# Patient Record
Sex: Female | Born: 1995 | Race: White | Hispanic: No | Marital: Single | Smoking: Never smoker
Health system: Southern US, Community
[De-identification: ages and names within clinical notes are randomized; demographics above are authoritative.]

## PROBLEM LIST (undated history)

## (undated) DIAGNOSIS — E119 Type 2 diabetes mellitus without complications: Secondary | ICD-10-CM

## (undated) DIAGNOSIS — R51 Headache: Secondary | ICD-10-CM

## (undated) HISTORY — DX: Headache: R51

## (undated) HISTORY — PX: WISDOM TOOTH EXTRACTION: SHX21

---
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
## 2016-01-25 DIAGNOSIS — L700 Acne vulgaris: Secondary | ICD-10-CM | POA: Diagnosis not present

## 2016-03-17 DIAGNOSIS — S63617A Unspecified sprain of left little finger, initial encounter: Secondary | ICD-10-CM | POA: Diagnosis not present

## 2016-03-17 DIAGNOSIS — S6992XA Unspecified injury of left wrist, hand and finger(s), initial encounter: Secondary | ICD-10-CM | POA: Diagnosis not present

## 2016-04-09 ENCOUNTER — Other Ambulatory Visit: Payer: Self-pay | Admitting: Nurse Practitioner

## 2016-04-09 DIAGNOSIS — Z01419 Encounter for gynecological examination (general) (routine) without abnormal findings: Secondary | ICD-10-CM | POA: Diagnosis not present

## 2016-04-09 DIAGNOSIS — N63 Unspecified lump in unspecified breast: Secondary | ICD-10-CM

## 2016-04-09 DIAGNOSIS — Z6822 Body mass index (BMI) 22.0-22.9, adult: Secondary | ICD-10-CM | POA: Diagnosis not present

## 2016-04-23 ENCOUNTER — Ambulatory Visit
Admission: RE | Admit: 2016-04-23 | Discharge: 2016-04-23 | Disposition: A | Discharge status: Home / Self Care | Payer: BLUE CROSS/BLUE SHIELD | Source: Ambulatory Visit | Attending: Nurse Practitioner | Admitting: Nurse Practitioner

## 2016-04-23 DIAGNOSIS — N63 Unspecified lump in unspecified breast: Secondary | ICD-10-CM

## 2016-04-23 DIAGNOSIS — N6320 Unspecified lump in the left breast, unspecified quadrant: Secondary | ICD-10-CM | POA: Diagnosis not present

## 2016-05-31 ENCOUNTER — Other Ambulatory Visit: Payer: Self-pay | Admitting: Nurse Practitioner

## 2016-05-31 DIAGNOSIS — D242 Benign neoplasm of left breast: Secondary | ICD-10-CM

## 2016-06-04 ENCOUNTER — Telehealth (INDEPENDENT_AMBULATORY_CARE_PROVIDER_SITE_OTHER): Payer: Self-pay | Admitting: Family

## 2016-06-04 NOTE — Telephone Encounter (Signed)
°  Who's calling (name and relationship to patient) : Chelsea Rollins (pt)  Best contact number: [phone]  Provider they see: [facility]  Reason for call: Patient left message on Jun 03, 2016 at 6:35pm.  She stated that she has had 3 dizzy spells and past out once.  Will be home for spring break and want to schedule an appt.    PRESCRIPTION REFILL ONLY  Name of prescription:  Pharmacy:

## 2016-06-04 NOTE — Telephone Encounter (Signed)
Lvm letting her know we were returning her call.

## 2016-06-06 NOTE — Telephone Encounter (Signed)
I called and left another message for Chelsea Rollins asking her to call back. I am happy to see her when she can come in for an appointment. TG

## 2016-09-02 ENCOUNTER — Ambulatory Visit
Admission: RE | Admit: 2016-09-02 | Discharge: 2016-09-02 | Disposition: A | Discharge status: Home / Self Care | Payer: BLUE CROSS/BLUE SHIELD | Source: Ambulatory Visit | Attending: Nurse Practitioner | Admitting: Nurse Practitioner

## 2016-09-02 ENCOUNTER — Encounter (HOSPITAL_BASED_OUTPATIENT_CLINIC_OR_DEPARTMENT_OTHER): Payer: Self-pay

## 2016-09-02 ENCOUNTER — Observation Stay (HOSPITAL_BASED_OUTPATIENT_CLINIC_OR_DEPARTMENT_OTHER)
Admission: EM | Admit: 2016-09-02 | Discharge: 2016-09-03 | Disposition: A | Discharge status: Home / Self Care | Payer: BLUE CROSS/BLUE SHIELD | Attending: Internal Medicine | Admitting: Internal Medicine

## 2016-09-02 ENCOUNTER — Other Ambulatory Visit: Payer: Self-pay | Admitting: Nurse Practitioner

## 2016-09-02 DIAGNOSIS — E1310 Other specified diabetes mellitus with ketoacidosis without coma: Secondary | ICD-10-CM

## 2016-09-02 DIAGNOSIS — E1165 Type 2 diabetes mellitus with hyperglycemia: Secondary | ICD-10-CM | POA: Diagnosis not present

## 2016-09-02 DIAGNOSIS — E1010 Type 1 diabetes mellitus with ketoacidosis without coma: Principal | ICD-10-CM

## 2016-09-02 DIAGNOSIS — B009 Herpesviral infection, unspecified: Secondary | ICD-10-CM | POA: Diagnosis not present

## 2016-09-02 DIAGNOSIS — Z79899 Other long term (current) drug therapy: Secondary | ICD-10-CM | POA: Insufficient documentation

## 2016-09-02 DIAGNOSIS — N6489 Other specified disorders of breast: Secondary | ICD-10-CM | POA: Diagnosis not present

## 2016-09-02 DIAGNOSIS — Z793 Long term (current) use of hormonal contraceptives: Secondary | ICD-10-CM | POA: Diagnosis not present

## 2016-09-02 DIAGNOSIS — N6320 Unspecified lump in the left breast, unspecified quadrant: Secondary | ICD-10-CM

## 2016-09-02 DIAGNOSIS — Z833 Family history of diabetes mellitus: Secondary | ICD-10-CM | POA: Insufficient documentation

## 2016-09-02 DIAGNOSIS — D242 Benign neoplasm of left breast: Secondary | ICD-10-CM

## 2016-09-02 DIAGNOSIS — N920 Excessive and frequent menstruation with regular cycle: Secondary | ICD-10-CM | POA: Diagnosis not present

## 2016-09-02 DIAGNOSIS — R631 Polydipsia: Secondary | ICD-10-CM | POA: Diagnosis not present

## 2016-09-02 DIAGNOSIS — E1110 Type 2 diabetes mellitus with ketoacidosis without coma: Secondary | ICD-10-CM | POA: Diagnosis present

## 2016-09-02 DIAGNOSIS — R739 Hyperglycemia, unspecified: Secondary | ICD-10-CM | POA: Diagnosis present

## 2016-09-02 DIAGNOSIS — E119 Type 2 diabetes mellitus without complications: Secondary | ICD-10-CM | POA: Diagnosis not present

## 2016-09-02 LAB — URINALYSIS, ROUTINE W REFLEX MICROSCOPIC
Bilirubin Urine: NEGATIVE
Glucose, UA: >=500 mg/dL — AB
Hgb urine dipstick: NEGATIVE
Ketones, ur: >80 mg/dL — AB
Nitrite: NEGATIVE
Protein, ur: NEGATIVE mg/dL
Specific Gravity, Urine: 1.041 — ABNORMAL HIGH (ref 1.005–1.030)
pH: 5.5 (ref 5.0–8.0)

## 2016-09-02 LAB — BASIC METABOLIC PANEL
Anion gap: 17 — ABNORMAL HIGH (ref 5–15)
BUN: 15 mg/dL (ref 6–20)
CO2: 19 mmol/L — ABNORMAL LOW (ref 22–32)
Calcium: 9.4 mg/dL (ref 8.9–10.3)
Chloride: 96 mmol/L — ABNORMAL LOW (ref 101–111)
Creatinine, Ser: 0.85 mg/dL (ref 0.44–1.00)
GFR calc Af Amer: >60 mL/min (ref >60)
GFR calc non Af Amer: >60 mL/min (ref >60)
Glucose, Bld: 392 mg/dL — ABNORMAL HIGH (ref 65–99)
Potassium: 3.3 mmol/L — ABNORMAL LOW (ref 3.5–5.1)
Sodium: 132 mmol/L — ABNORMAL LOW (ref 135–145)

## 2016-09-02 LAB — I-STAT VENOUS BLOOD GAS, ED
Acid-base deficit: 5 mmol/L — ABNORMAL HIGH (ref 0.0–2.0)
Bicarbonate: 18.4 mmol/L — ABNORMAL LOW (ref 20.0–28.0)
O2 Saturation: 81 %
Patient temperature: 98.6
TCO2: 19 mmol/L (ref 0–100)
pCO2, Ven: 30.3 mmHg — ABNORMAL LOW (ref 44.0–60.0)
pH, Ven: 7.391 (ref 7.250–7.430)
pO2, Ven: 45 mmHg (ref 32.0–45.0)

## 2016-09-02 LAB — CBG MONITORING, ED
Glucose-Capillary: 400 mg/dL — ABNORMAL HIGH (ref 65–99)
Glucose-Capillary: 333 mg/dL — ABNORMAL HIGH (ref 65–99)
Glucose-Capillary: 194 mg/dL — ABNORMAL HIGH (ref 65–99)

## 2016-09-02 LAB — CBC WITH DIFFERENTIAL/PLATELET
Basophils Absolute: 0 10*3/uL (ref 0.0–0.1)
Basophils Relative: 0 %
Eosinophils Absolute: 0 10*3/uL (ref 0.0–0.7)
Eosinophils Relative: 0 %
HCT: 37.3 % (ref 36.0–46.0)
Hemoglobin: 13.6 g/dL (ref 12.0–15.0)
Lymphocytes Relative: 20 %
Lymphs Abs: 2.2 10*3/uL (ref 0.7–4.0)
MCH: 31.2 pg (ref 26.0–34.0)
MCHC: 36.5 g/dL — ABNORMAL HIGH (ref 30.0–36.0)
MCV: 85.6 fL (ref 78.0–100.0)
Monocytes Absolute: 0.5 10*3/uL (ref 0.1–1.0)
Monocytes Relative: 5 %
Neutro Abs: 7.9 10*3/uL — ABNORMAL HIGH (ref 1.7–7.7)
Neutrophils Relative %: 75 %
Platelets: 288 10*3/uL (ref 150–400)
RBC: 4.36 MIL/uL (ref 3.87–5.11)
RDW: 11.4 % — ABNORMAL LOW (ref 11.5–15.5)
WBC: 10.7 10*3/uL — ABNORMAL HIGH (ref 4.0–10.5)

## 2016-09-02 LAB — URINALYSIS, MICROSCOPIC (REFLEX): RBC / HPF: NONE SEEN RBC/hpf (ref 0–5)

## 2016-09-02 LAB — GLUCOSE, CAPILLARY: Glucose-Capillary: 94 mg/dL (ref 65–99)

## 2016-09-02 LAB — PREGNANCY, URINE: Preg Test, Ur: NEGATIVE

## 2016-09-02 MED ORDER — DEXTROSE-NACL 5-0.45 % IV SOLN
INTRAVENOUS | Status: DC
  Administered 2016-09-02: 22:00:00 via INTRAVENOUS

## 2016-09-02 MED ORDER — SODIUM CHLORIDE 0.9 % IV SOLN
INTRAVENOUS | Status: DC
  Administered 2016-09-02: 22:00:00 via INTRAVENOUS

## 2016-09-02 MED ORDER — SODIUM CHLORIDE 0.9 % IV SOLN
INTRAVENOUS | Status: DC
  Administered 2016-09-02: 2.7 [IU]/h via INTRAVENOUS
  Filled 2016-09-02 (×2): qty 1

## 2016-09-02 MED ORDER — SODIUM CHLORIDE 0.9 % IV BOLUS (SEPSIS)
1000.0000 mL | Freq: Once | INTRAVENOUS | Status: AC
  Administered 2016-09-02: 1000 mL via INTRAVENOUS

## 2016-09-02 NOTE — ED Notes (Signed)
Attempted to call report, RN unable to take report at this time.

## 2016-09-02 NOTE — ED Notes (Signed)
Reports x 2-66months increased thirst, urination, dizziness, nausea and weight loss.  Family hx of DM 2

## 2016-09-02 NOTE — ED Provider Notes (Signed)
[facility] DEPT [facility] Provider Note   CSN: [id number] Arrival date & time: 09/02/16  2033  By signing my name below, I, Dora Sims, attest that this documentation has been prepared under the direction and in the presence of physician practitioner, Davonna Belling, MD. Electronically Signed: Dora Sims, Scribe. 09/02/2016. 9:02 PM.  History   Chief Complaint Chief Complaint  Patient presents with  . Hyperglycemia   The history is provided by the patient. No language interpreter was used.    HPI Comments: Chelsea Rollins is a 21 y.o. female who presents to the Emergency Department for evaluation of hyperglycemia. She states she has had persistent polyuria, urinary frequency, fatigue, polyphagia, and polydipsia for about two months as well as a 10 lb weight loss over this time span. Patient's father has DM type II; she measured her blood glucose with his glucometer that goes up to 600 mg/dL this afternoon and it read "High". She was subsequently evaluated at [facility] and her blood glucose was measured at >400 md/dL. Patient exercises frequently and initially thought her weight loss was a result of this. No h/o steroid medication use. Patient has never been diagnosed with DM. She denies the possibility of pregnancy. She further denies fevers, chills, or any other associated symptoms.  Past Medical History:  Diagnosis Date  . QHUTMLYY(503.5)     Patient Active Problem List   Diagnosis Date Noted  . DKA (diabetic ketoacidoses) ([place]) 09/02/2016  . Migraine without aura, without mention of intractable migraine without mention of status migrainosus 10/12/2012  . Episodic tension type headache 10/12/2012    Past Surgical History:  Procedure Laterality Date  . SHOULDER SURGERY  05/12/13   right shoulder- torn labrum(loose ligaments)  . WISDOM TOOTH EXTRACTION      OB History    No data available       Home Medications    Prior to Admission medications     Medication Sig Start Date End Date Taking? Authorizing Provider  VALACYCLOVIR HCL PO Take by mouth.   Yes [provider]  Adapalene-Benzoyl Peroxide (EPIDUO) 0.1-2.5 % gel Apply 1 application topically at bedtime as needed (for acne).    [provider]  drospirenone-ethinyl estradiol (YAZ,GIANVI,LORYNA) 3-0.02 MG tablet Take 1 tablet by mouth daily.    [provider]    Family History Family History  Problem Relation Age of Onset  . Hypertension Father   . Heart Problems Maternal Grandfather        Died at 85  . Heart Problems Paternal Grandmother        Died at 49  . Heart Problems Paternal Grandfather        Died at 51  . Diabetes Other     Social History Social History  Substance Use Topics  . Smoking status: Never Smoker  . Smokeless tobacco: Never Used  . Alcohol use No     Allergies   Patient has no known allergies.   Review of Systems Review of Systems  Constitutional: Positive for fatigue and unexpected weight change (loss of 10 lbs). Negative for chills and fever.  Endocrine: Positive for polydipsia, polyphagia and polyuria.  Genitourinary: Positive for frequency.   Physical Exam Updated Vital Signs BP 125/88   Pulse 100   Temp 99.1 F (37.3 C) (Oral)   Resp (!) 25   Wt 120 lb (54.4 kg)   LMP 08/15/2016   SpO2 100%   Physical Exam  Constitutional: She is oriented to person, place, and time.  She appears well-developed and well-nourished. No distress.  HENT:  Head: Normocephalic and atraumatic.  Eyes: Conjunctivae and EOM are normal.  Neck: Neck supple. No tracheal deviation present.  Cardiovascular: Tachycardia present.   Mild tachycardia.  Pulmonary/Chest: Effort normal. No respiratory distress.  Musculoskeletal: Normal range of motion.  Neurological: She is alert and oriented to person, place, and time.  Skin: Skin is warm and dry.  Psychiatric: She has a normal mood and affect. Her behavior is normal.  Nursing  note and vitals reviewed.  ED Treatments / Results  Labs (all labs ordered are listed, but only abnormal results are displayed) Labs Reviewed  BASIC METABOLIC PANEL - Abnormal; Notable for the following:       Result Value   Sodium 132 (*)    Potassium 3.3 (*)    Chloride 96 (*)    CO2 19 (*)    Glucose, Bld 392 (*)    Anion gap 17 (*)    All other components within normal limits  CBC WITH DIFFERENTIAL/PLATELET - Abnormal; Notable for the following:    WBC 10.7 (*)    MCHC 36.5 (*)    RDW 11.4 (*)    Neutro Abs 7.9 (*)    All other components within normal limits  URINALYSIS, ROUTINE W REFLEX MICROSCOPIC - Abnormal; Notable for the following:    Specific Gravity, Urine 1.041 (*)    Glucose, UA >=500 (*)    Ketones, ur >80 (*)    Leukocytes, UA TRACE (*)    All other components within normal limits  URINALYSIS, MICROSCOPIC (REFLEX) - Abnormal; Notable for the following:    Bacteria, UA RARE (*)    Squamous Epithelial / LPF 0-5 (*)    All other components within normal limits  CBG MONITORING, ED - Abnormal; Notable for the following:    Glucose-Capillary 400 (*)    All other components within normal limits  CBG MONITORING, ED - Abnormal; Notable for the following:    Glucose-Capillary 333 (*)    All other components within normal limits  I-STAT VENOUS BLOOD GAS, ED - Abnormal; Notable for the following:    pCO2, Ven 30.3 (*)    Bicarbonate 18.4 (*)    Acid-base deficit 5.0 (*)    All other components within normal limits  CBG MONITORING, ED - Abnormal; Notable for the following:    Glucose-Capillary 194 (*)    All other components within normal limits  PREGNANCY, URINE  BLOOD GAS, VENOUS    EKG  EKG Interpretation None       Radiology US Breast Ltd Uni Left Inc Axilla  Result Date: 09/02/2016 CLINICAL DATA:  21 year old female presenting for 3 month follow-up of a probably benign left breast mass. EXAM: ULTRASOUND OF THE LEFT BREAST COMPARISON:  Previous  exam(s). FINDINGS: Ultrasound of the circumscribed oval mass in the left breast at 6 o'clock, 1 cm from the nipple is stable in size and appearance measuring 3.2 x 1.1 x 2.7 cm, previously measuring 3.3 x 1.1 x 2.7 cm. IMPRESSION: The probably benign left breast mass at 6 o'clock is stable. RECOMMENDATION: A six-month follow-up left breast ultrasound is recommended to ensure continued stability of the probably benign left breast mass. If stable at that time, I would recommend 1 final follow-up in January of 2020 to complete a 2 year period. I have discussed the findings and recommendations with the patient. Results were also provided in writing at the conclusion of the visit. If applicable, a reminder letter will  be sent to the patient regarding the next appointment. BI-RADS CATEGORY  3: Probably benign. Electronically Signed   By: Ammie Ferrier M.D.   On: 09/02/2016 11:39    Procedures Procedures (including critical care time)  DIAGNOSTIC STUDIES: Oxygen Saturation is 100% on RA, normal by my interpretation.    COORDINATION OF CARE: 9:00 PM Discussed treatment plan with pt at bedside and pt agreed to plan.  Medications Ordered in ED Medications  insulin regular (NOVOLIN R,HUMULIN R) 100 Units in sodium chloride 0.9 % 100 mL (1 Units/mL) infusion (1.3 Units/hr Intravenous Transfusing/Transfer 09/02/16 2300)  0.9 %  sodium chloride infusion ( Intravenous Stopped 09/02/16 2258)  dextrose 5 %-0.45 % sodium chloride infusion ( Intravenous Transfusing/Transfer 09/02/16 2259)  sodium chloride 0.9 % bolus 1,000 mL (0 mLs Intravenous Stopped 09/02/16 2139)     Initial Impression / Assessment and Plan / ED Course  I have reviewed the triage vital signs and the nursing notes.  Pertinent labs & imaging results that were available during my care of the patient were reviewed by me and considered in my medical decision making (see chart for details).     Patient with new onset diabetes. Mild DKA with  normal pH but does have anion gap and greater than 80 ketones in the urine. Mild tachycardia. Well-appearing. With the acidosis will admit for IV insulin. Discussed with Dr. Hal Hope. Transfer to [facility].  Final Clinical Impressions(s) / ED Diagnoses   Final diagnoses:  Diabetic ketoacidosis without coma associated with type 1 diabetes mellitus ([place])    New Prescriptions New Prescriptions   No medications on file   I personally performed the services described in this documentation, which was scribed in my presence. The recorded information has been reviewed and is accurate.      Davonna Belling, MD 09/02/16 [phone]

## 2016-09-02 NOTE — ED Triage Notes (Signed)
Pt sent from PCP for HIGH BS-is not a known DM-NAD-steady gait

## 2016-09-03 ENCOUNTER — Encounter (HOSPITAL_COMMUNITY): Payer: Self-pay | Admitting: Internal Medicine

## 2016-09-03 DIAGNOSIS — N920 Excessive and frequent menstruation with regular cycle: Secondary | ICD-10-CM | POA: Diagnosis present

## 2016-09-03 DIAGNOSIS — N921 Excessive and frequent menstruation with irregular cycle: Secondary | ICD-10-CM

## 2016-09-03 DIAGNOSIS — B009 Herpesviral infection, unspecified: Secondary | ICD-10-CM | POA: Diagnosis not present

## 2016-09-03 DIAGNOSIS — E1310 Other specified diabetes mellitus with ketoacidosis without coma: Secondary | ICD-10-CM

## 2016-09-03 LAB — BASIC METABOLIC PANEL
Anion gap: 10 (ref 5–15)
Anion gap: 8 (ref 5–15)
BUN: 11 mg/dL (ref 6–20)
BUN: 10 mg/dL (ref 6–20)
CO2: 23 mmol/L (ref 22–32)
CO2: 22 mmol/L (ref 22–32)
Calcium: 8.7 mg/dL — ABNORMAL LOW (ref 8.9–10.3)
Calcium: 8.8 mg/dL — ABNORMAL LOW (ref 8.9–10.3)
Chloride: 104 mmol/L (ref 101–111)
Chloride: 105 mmol/L (ref 101–111)
Creatinine, Ser: 0.7 mg/dL (ref 0.44–1.00)
Creatinine, Ser: 0.62 mg/dL (ref 0.44–1.00)
GFR calc Af Amer: >60 mL/min (ref >60)
GFR calc Af Amer: >60 mL/min (ref >60)
GFR calc non Af Amer: >60 mL/min (ref >60)
GFR calc non Af Amer: >60 mL/min (ref >60)
Glucose, Bld: 125 mg/dL — ABNORMAL HIGH (ref 65–99)
Glucose, Bld: 164 mg/dL — ABNORMAL HIGH (ref 65–99)
Potassium: 2.9 mmol/L — ABNORMAL LOW (ref 3.5–5.1)
Potassium: 3.7 mmol/L (ref 3.5–5.1)
Sodium: 137 mmol/L (ref 135–145)
Sodium: 135 mmol/L (ref 135–145)

## 2016-09-03 LAB — RAPID URINE DRUG SCREEN, HOSP PERFORMED
Amphetamines: NOT DETECTED
Barbiturates: NOT DETECTED
Benzodiazepines: NOT DETECTED
Cocaine: NOT DETECTED
Opiates: NOT DETECTED
Tetrahydrocannabinol: NOT DETECTED

## 2016-09-03 LAB — I-STAT VENOUS BLOOD GAS, ED
Acid-base deficit: 6 mmol/L — ABNORMAL HIGH (ref 0.0–2.0)
Bicarbonate: 18.3 mmol/L — ABNORMAL LOW (ref 20.0–28.0)
O2 Saturation: 78 %
Patient temperature: 98.6
TCO2: 19 mmol/L (ref 0–100)
pCO2, Ven: 30.8 mmHg — ABNORMAL LOW (ref 44.0–60.0)
pH, Ven: 7.383 (ref 7.250–7.430)
pO2, Ven: 43 mmHg (ref 32.0–45.0)

## 2016-09-03 LAB — HIV ANTIBODY (ROUTINE TESTING W REFLEX): HIV Screen 4th Generation wRfx: NONREACTIVE

## 2016-09-03 LAB — GLUCOSE, CAPILLARY
Glucose-Capillary: 118 mg/dL — ABNORMAL HIGH (ref 65–99)
Glucose-Capillary: 127 mg/dL — ABNORMAL HIGH (ref 65–99)
Glucose-Capillary: 133 mg/dL — ABNORMAL HIGH (ref 65–99)
Glucose-Capillary: 133 mg/dL — ABNORMAL HIGH (ref 65–99)
Glucose-Capillary: 139 mg/dL — ABNORMAL HIGH (ref 65–99)
Glucose-Capillary: 150 mg/dL — ABNORMAL HIGH (ref 65–99)
Glucose-Capillary: 158 mg/dL — ABNORMAL HIGH (ref 65–99)
Glucose-Capillary: 209 mg/dL — ABNORMAL HIGH (ref 65–99)

## 2016-09-03 LAB — MRSA PCR SCREENING: MRSA by PCR: NEGATIVE

## 2016-09-03 MED ORDER — INSULIN GLARGINE 100 UNIT/ML ~~LOC~~ SOLN
10.0000 [IU] | Freq: Every day | SUBCUTANEOUS | Status: DC
  Filled 2016-09-03: qty 0.1

## 2016-09-03 MED ORDER — INSULIN GLARGINE 100 UNIT/ML ~~LOC~~ SOLN
7.0000 [IU] | Freq: Every day | SUBCUTANEOUS | Status: DC
  Filled 2016-09-03: qty 0.07

## 2016-09-03 MED ORDER — VALACYCLOVIR HCL 500 MG PO TABS
500.0000 mg | ORAL_TABLET | Freq: Every day | ORAL | Status: DC
  Administered 2016-09-03: 500 mg via ORAL
  Filled 2016-09-03: qty 1

## 2016-09-03 MED ORDER — INSULIN GLARGINE 100 UNIT/ML SOLOSTAR PEN: 7.0000 [IU] | PEN_INJECTOR | Freq: Every day | SUBCUTANEOUS | 11 refills | Status: DC

## 2016-09-03 MED ORDER — POTASSIUM CHLORIDE 10 MEQ/100ML IV SOLN
10.0000 meq | INTRAVENOUS | Status: AC
  Administered 2016-09-03 (×4): 10 meq via INTRAVENOUS
  Filled 2016-09-03 (×4): qty 100

## 2016-09-03 MED ORDER — INSULIN PEN NEEDLE 31G X 5 MM MISC: 1.0000 "application " | Freq: Every day | 0 refills | Status: AC

## 2016-09-03 MED ORDER — INSULIN STARTER KIT- PEN NEEDLES (ENGLISH)
1.0000 | Freq: Once | Status: AC
  Administered 2016-09-03: 1
  Filled 2016-09-03: qty 1

## 2016-09-03 MED ORDER — INSULIN ASPART 100 UNIT/ML ~~LOC~~ SOLN
0.0000 [IU] | Freq: Three times a day (TID) | SUBCUTANEOUS | Status: DC
  Administered 2016-09-03: 2 [IU] via SUBCUTANEOUS
  Administered 2016-09-03: 1 [IU] via SUBCUTANEOUS
  Administered 2016-09-03: 3 [IU] via SUBCUTANEOUS

## 2016-09-03 MED ORDER — SODIUM CHLORIDE 0.9 % IV SOLN
INTRAVENOUS | Status: DC
  Filled 2016-09-03: qty 1

## 2016-09-03 MED ORDER — BLOOD GLUCOSE METER KIT: PACK | 0 refills | Status: AC

## 2016-09-03 MED ORDER — INSULIN GLARGINE 100 UNIT/ML ~~LOC~~ SOLN
5.0000 [IU] | Freq: Every day | SUBCUTANEOUS | Status: DC
  Administered 2016-09-03: 5 [IU] via SUBCUTANEOUS
  Filled 2016-09-03: qty 0.05

## 2016-09-03 MED ORDER — DESOGESTREL-ETHINYL ESTRADIOL 0.15-30 MG-MCG PO TABS: 1.0000 | ORAL_TABLET | Freq: Every day | ORAL | Status: DC

## 2016-09-03 MED ORDER — POTASSIUM CHLORIDE CRYS ER 20 MEQ PO TBCR
20.0000 meq | EXTENDED_RELEASE_TABLET | Freq: Once | ORAL | Status: AC
  Administered 2016-09-03: 20 meq via ORAL
  Filled 2016-09-03: qty 1

## 2016-09-03 MED ORDER — SODIUM CHLORIDE 0.9 % IV SOLN
INTRAVENOUS | Status: DC
  Administered 2016-09-03: 1000 mL via INTRAVENOUS
  Administered 2016-09-03: 03:00:00 via INTRAVENOUS

## 2016-09-03 MED ORDER — SODIUM CHLORIDE 0.9 % IV SOLN: INTRAVENOUS | Status: AC

## 2016-09-03 MED ORDER — INSULIN GLARGINE 100 UNIT/ML ~~LOC~~ SOLN: 8.0000 [IU] | Freq: Every day | SUBCUTANEOUS | Status: DC

## 2016-09-03 MED ORDER — INSULIN DETEMIR 100 UNIT/ML FLEXPEN: 7.0000 [IU] | PEN_INJECTOR | Freq: Every day | SUBCUTANEOUS | 11 refills | Status: DC

## 2016-09-03 MED ORDER — DEXTROSE-NACL 5-0.45 % IV SOLN
INTRAVENOUS | Status: DC
  Administered 2016-09-03: 02:00:00 via INTRAVENOUS

## 2016-09-03 NOTE — Progress Notes (Signed)
This CM was asked by staff RN to help pt establish PCP. This CM met with pt and mother at bedside for discharge planning. Mom states that she plans on getting pt an appointment with Alroy Bailiff PA at [place] at [facility]. This is where mom goes and has the same insurance as the pt. Pt also states that she plans to get a referral from Alroy Bailiff PA for an Endocrinologist in [place] where she lives.  Pt to get script for glucose meter, strips and lancets from MD. Marney Doctor RN,BSN,NCM [phone]

## 2016-09-03 NOTE — Progress Notes (Signed)
Notified diabetic coordinator and CM to see patient.

## 2016-09-03 NOTE — Plan of Care (Signed)
Problem: Food- and Nutrition-Related Knowledge Deficit (NB-1.1) Goal: Nutrition education Formal process to instruct or train a patient/client in a skill or to impart knowledge to help patients/clients voluntarily manage or modify food choices and eating behavior to maintain or improve health.  Outcome: Completed/Met Date Met: 09/03/16  RD consulted for nutrition education regarding diabetes.   No results found for: HGBA1C  RD provided "Carbohydrate Counting for People with Diabetes" handout from the Academy of Nutrition and Dietetics and "MyPlate" handout. Discussed different food groups and their effects on blood sugar, emphasizing carbohydrate-containing foods. Provided list of carbohydrates and recommended serving sizes of common foods.  Discussed importance of controlled and consistent carbohydrate intake throughout the day. Provided examples of ways to balance meals/snacks and encouraged intake of high-fiber, whole grain complex carbohydrates. Teach back method used.  Expect good compliance. Pt with great support from family. Pt's father has T2DM per pt's mother. Pt reports eating fairly healthy already. Pt is physically active. Pt follows a mix between a mediterranean/paleo diet. Recommended outpatient follow-up with CDE. Pt currently transitioning between doctors.  Body mass index is 20.81 kg/m. Pt meets criteria for normal based on current BMI.  Current diet order is CHO modified, patient is consuming approximately 100% of meals at this time. Labs and medications reviewed. No further nutrition interventions warranted at this time. If additional nutrition issues arise, please re-consult RD.  Clayton Bibles, MS, RD, LDN Pager: [phone] After Hours Pager: [phone]

## 2016-09-03 NOTE — Progress Notes (Signed)
Discharge instructions,prescriptions given, verbalized understanding. Health teachings re: insulin injection provided, she successfully performed procedure. All questions answered appropriately. In good spirits.

## 2016-09-03 NOTE — H&P (Signed)
History and Physical    Chelsea Rollins ZOX:[id number] DOB: [birth year] DOA: 09/02/2016  PCP: Karleen Dolphin, MD; changing to Sadie Haber at [facility]:  None Patient coming from: apartment in [place]: parents, [phone]  Chief Complaint: high blood sugar  HPI: Chelsea Rollins is a 21 y.o. female with medical history significant of HSV presenting with hyperglycemia.  About March, the patient began feeling dizzy - particualrly after working out.  Then it progressed to random points in the day.  She has lost 10 pounds (which she attributed to exercise).  Very thirsty, peeing a lot.  Works out 5-6 days a week - yoga, spin, pilates, weight lifting/circuit training.  Charity fundraiser, working 2 jobs.  Symptoms really bad after working out.  Dad has DM so she checked her sugar today.  Blood sugar was "hi".  They went to urgent care today and it said "hi" and they sent her to [place].  Strong FH of DM.    Also with irregular periods - lasting 10-14 days despite OCPs.   She had an appointment with GYN later this week to discuss this issue.    ED Course: New-onset DM - mild DKA, normal pH, +anion gap, >80 ketones in urine.  IV insulin.  Admit to [facility].  Review of Systems: As per HPI; otherwise review of systems reviewed and negative.   Ambulatory Status:  Ambulates without assistance  Past Medical History:  Diagnosis Date  . FAOZHYQM(578.4)     Past Surgical History:  Procedure Laterality Date  . SHOULDER SURGERY  05/12/13   right shoulder- torn labrum(loose ligaments)  . WISDOM TOOTH EXTRACTION      Social History   Social History  . Marital status: Single    Spouse name: N/A  . Number of children: N/A  . Years of education: N/A   Occupational History  . student    Social History Main Topics  . Smoking status: Never Smoker  . Smokeless tobacco: Never Used  . Alcohol use No  . Drug use: No  . Sexual activity: Not on file   Other Topics Concern    . Not on file   Social History Narrative  . No narrative on file    No Known Allergies  Family History  Problem Relation Age of Onset  . Hypertension Father   . Diabetes Mellitus II Father   . Heart Problems Maternal Grandfather        Died at 53  . Diabetes Maternal Grandfather   . Heart Problems Paternal Grandmother        Died at 4  . Heart Problems Paternal Grandfather        Died at 24  . Diabetes Other     Prior to Admission medications   Medication Sig Start Date End Date Taking? Authorizing Provider  ISIBLOOM 0.15-30 MG-MCG tablet Take 1 tablet by mouth daily. 08/29/16  Yes [provider]  Multiple Vitamin (MULTIVITAMIN) capsule Take 1 capsule by mouth daily.   Yes [provider]  valACYclovir (VALTREX) 500 MG tablet Take 500 mg by mouth daily. 07/08/16  Yes [provider]    Physical Exam: Vitals:   09/02/16 2248 09/02/16 2337 09/02/16 2338 09/03/16 0000  BP: 125/88 121/83    Pulse: 100 96  (!) 103  Resp: (!) 25 14  18   Temp:   98.2 F (36.8 C)   TempSrc:   Oral   SpO2: 100% 100%  99%  Weight:  55 kg (121 lb 4.1 oz)  Height:    5\' 4"  (1.626 m)     General:  Appears calm and comfortable and is NAD Eyes:  PERRL, EOMI, normal lids, iris ENT:  grossly normal hearing, lips & tongue, mmm Neck:  no LAD, masses or thyromegaly Cardiovascular:  RRR, mild tachycardia, no m/r/g. No LE edema.  Respiratory:  CTA bilaterally, no w/r/r. Normal respiratory effort. Abdomen:  soft, ntnd, NABS Skin:  no rash or induration seen on limited exam Musculoskeletal:  grossly normal tone BUE/BLE, good ROM, no bony abnormality Psychiatric:  grossly normal mood and affect, speech fluent and appropriate, AOx3 Neurologic:  CN 2-12 grossly intact, moves all extremities in coordinated fashion, sensation intact  Labs on Admission: I have personally reviewed following labs and imaging studies  CBC:  Recent Labs Lab 09/02/16 2059  WBC 10.7*   NEUTROABS 7.9*  HGB 13.6  HCT 37.3  MCV 85.6  PLT 379   Basic Metabolic Panel:  Recent Labs Lab 09/02/16 2059  NA 132*  K 3.3*  CL 96*  CO2 19*  GLUCOSE 392*  BUN 15  CREATININE 0.85  CALCIUM 9.4   GFR: Estimated Creatinine Clearance: 91.2 mL/min (by C-G formula based on SCr of 0.85 mg/dL). Liver Function Tests: No results for input(s): AST, ALT, ALKPHOS, BILITOT, PROT, ALBUMIN in the last 168 hours. No results for input(s): LIPASE, AMYLASE in the last 168 hours. No results for input(s): AMMONIA in the last 168 hours. Coagulation Profile: No results for input(s): INR, PROTIME in the last 168 hours. Cardiac Enzymes: No results for input(s): CKTOTAL, CKMB, CKMBINDEX, TROPONINI in the last 168 hours. BNP (last 3 results) No results for input(s): PROBNP in the last 8760 hours. HbA1C: No results for input(s): HGBA1C in the last 72 hours. CBG:  Recent Labs Lab 09/02/16 2043 09/02/16 2144 09/02/16 2247 09/02/16 2350  GLUCAP 400* 333* 194* 94   Lipid Profile: No results for input(s): CHOL, HDL, LDLCALC, TRIG, CHOLHDL, LDLDIRECT in the last 72 hours. Thyroid Function Tests: No results for input(s): TSH, T4TOTAL, FREET4, T3FREE, THYROIDAB in the last 72 hours. Anemia Panel: No results for input(s): VITAMINB12, FOLATE, FERRITIN, TIBC, IRON, RETICCTPCT in the last 72 hours. Urine analysis:    Component Value Date/Time   COLORURINE YELLOW 09/02/2016 2100   APPEARANCEUR CLEAR 09/02/2016 2100   LABSPEC 1.041 (H) 09/02/2016 2100   PHURINE 5.5 09/02/2016 2100   GLUCOSEU >=500 (A) 09/02/2016 2100   HGBUR NEGATIVE 09/02/2016 2100   BILIRUBINUR NEGATIVE 09/02/2016 2100   KETONESUR >80 (A) 09/02/2016 2100   PROTEINUR NEGATIVE 09/02/2016 2100   NITRITE NEGATIVE 09/02/2016 2100   LEUKOCYTESUR TRACE (A) 09/02/2016 2100    Creatinine Clearance: Estimated Creatinine Clearance: 91.2 mL/min (by C-G formula based on SCr of 0.85 mg/dL).  Sepsis  Labs: @LABRCNTIP (procalcitonin:4,lacticidven:4) )No results found for this or any previous visit (from the past 240 hour(s)).   Radiological Exams on Admission: US Breast Ltd Uni Left Inc Axilla  Result Date: 09/02/2016 CLINICAL DATA:  20 year old female presenting for 3 month follow-up of a probably benign left breast mass. EXAM: ULTRASOUND OF THE LEFT BREAST COMPARISON:  Previous exam(s). FINDINGS: Ultrasound of the circumscribed oval mass in the left breast at 6 o'clock, 1 cm from the nipple is stable in size and appearance measuring 3.2 x 1.1 x 2.7 cm, previously measuring 3.3 x 1.1 x 2.7 cm. IMPRESSION: The probably benign left breast mass at 6 o'clock is stable. RECOMMENDATION: A six-month follow-up left breast ultrasound is recommended  to ensure continued stability of the probably benign left breast mass. If stable at that time, I would recommend 1 final follow-up in January of 2020 to complete a 2 year period. I have discussed the findings and recommendations with the patient. Results were also provided in writing at the conclusion of the visit. If applicable, a reminder letter will be sent to the patient regarding the next appointment. BI-RADS CATEGORY  3: Probably benign. Electronically Signed   By: Ammie Ferrier M.D.   On: 09/02/2016 11:39    EKG: not done  Assessment/Plan Principal Problem:   DKA (diabetic ketoacidoses) ([facility]) Active Problems:   HSV infection   Menorrhagia   Pertinent labs:  Glucose: 400, 392, 333, 194, 94 VBG: 7.391/30.2/HCO3 18.4 WBC 10.7 UA: >500 glucose, >80 ketones Upreg negative  DKA -Patient with new-onset DM -Based on extremely mild DKA (normal pH, minimal depressed CO2/HCO3), suspect that this is more related to profound dehydration and type 2 DM (although she does not have the body habitus for this at all) -No indication of illness as source -Will admit to SDU with DKA protocol -Will check BMP q4h but suspect that she may be close to ready for  transition to SQ insulin already -K+low at time of presentation so potassium supplementation added -IVF at 150 cc/hr, NS until glucose <250 and then decrease rate to 125 and change to D51/2NS -Diabetic educator to see patient in AM -Suspect that patient will respond quickly and will not require prolonged hospitalization  HSV -Continue Valtrex  Menorrhagia -patient is having prolonged periods despite OCPs -This may be influenced by her uncontrolled DM -She may also need a higher dose of hormone -Suggest waiting until DM is controlled and then following up with GYN if needed  DVT prophylaxis: Early ambulation Code Status:  Full - confirmed with patient/family Family Communication: Parents present throughout evaluation  Disposition Plan:  Home once clinically improved Consults called: Diabetes coordinator  Admission status: Admit - It is my clinical opinion that admission to INPATIENT is reasonable and necessary because this patient will require at least 2 midnights in the hospital to treat this condition based on the medical complexity of the problems presented.  Given the aforementioned information, the predictability of an adverse outcome is felt to be significant.     Karmen Bongo MD [facility]  If 7PM-7AM, please contact night-coverage [email or web address] Password [facility]  09/03/2016, 12:52 AM

## 2016-09-03 NOTE — Discharge Summary (Signed)
Physician Discharge Summary  Chelsea Rollins UUV:[id number] DOB: 03/05/1996 DOA: 09/02/2016  PCP: Scherger, Rubab U, PA-C  Admit date: 09/02/2016 Discharge date: 09/03/2016  Admitted From: Home  Disposition:  Home   Recommendations for Outpatient Follow-up:  1. Follow up with PCP in 1-2 weeks 2. Please obtain BMP/CBC in one week 3. Please follow up on the following pending results: Please follow results of Hb-A1c.     Discharge Condition: Stable.  CODE STATUS: Full code.  Diet recommendation;  Carb Modified  Brief/Interim Summary: Chelsea Rollins is a 21 y.o. female with medical history significant of HSV presenting with hyperglycemia.  About March, the patient began feeling dizzy - particualrly after working out.  Then it progressed to random points in the day.  She has lost 10 pounds (which she attributed to exercise).  Very thirsty, peeing a lot.  Works out 5-6 days a week - yoga, spin, pilates, weight lifting/circuit training.  Charity fundraiser, working 2 jobs.  Symptoms really bad after working out.  Dad has DM so she checked her sugar today.  Blood sugar was "hi".  They went to urgent care today and it said "hi" and they sent her to [place].  Strong FH of DM.    Also with irregular periods - lasting 10-14 days despite OCPs.   She had an appointment with GYN later this week to discuss this issue.   1-DKA, New diagnosis of Diabetes;  Presents with CBG 400, gap at 17, bicarb 18.  Suspect type one,  Discharge on levemir 7 units.  Close follow up with PCP for further adjustment of medications.  Gap close, cbg 150.  Education provided.   Discharge Diagnoses:  Principal Problem:   DKA (diabetic ketoacidoses) ([place]) Active Problems:   HSV infection   Menorrhagia    Discharge Instructions  Discharge Instructions    Diet Carb Modified    Complete by:  As directed    Increase activity slowly    Complete by:  As directed    PR SYRINGE W/NEEDLE INSULIN 3CC     Complete by:  As directed      Allergies as of 09/03/2016   No Known Allergies     Medication List    TAKE these medications   blood glucose meter kit and supplies Check blood sugar three times a day before meals and at bedtime.   Insulin Detemir 100 UNIT/ML Pen Commonly known as:  LEVEMIR Inject 7 Units into the skin daily at 10 pm.   Insulin Pen Needle 31G X 5 MM Misc 1 application by Does not apply route daily.   ISIBLOOM 0.15-30 MG-MCG tablet Generic drug:  desogestrel-ethinyl estradiol Take 1 tablet by mouth daily.   multivitamin capsule Take 1 capsule by mouth daily.   valACYclovir 500 MG tablet Commonly known as:  VALTREX Take 500 mg by mouth daily.      Follow-up Information    Scherger, Rubab U, PA-C Follow up in 1 week(s).   Contact information: [address] [place] [address] [phone]          No Known Allergies  Consultations:  none   Procedures/Studies: US Breast Ltd Uni Left Inc Axilla  Result Date: 09/02/2016 CLINICAL DATA:  21 year old female presenting for 3 month follow-up of a probably benign left breast mass. EXAM: ULTRASOUND OF THE LEFT BREAST COMPARISON:  Previous exam(s). FINDINGS: Ultrasound of the circumscribed oval mass in the left breast at 6 o'clock, 1 cm from the nipple is stable  in size and appearance measuring 3.2 x 1.1 x 2.7 cm, previously measuring 3.3 x 1.1 x 2.7 cm. IMPRESSION: The probably benign left breast mass at 6 o'clock is stable. RECOMMENDATION: A six-month follow-up left breast ultrasound is recommended to ensure continued stability of the probably benign left breast mass. If stable at that time, I would recommend 1 final follow-up in January of 2020 to complete a 2 year period. I have discussed the findings and recommendations with the patient. Results were also provided in writing at the conclusion of the visit. If applicable, a reminder letter will be sent to the patient regarding the next appointment.  BI-RADS CATEGORY  3: Probably benign. Electronically Signed   By: Ammie Ferrier M.D.   On: 09/02/2016 11:39      Subjective: Feeling well,. She follows paleo diet   Discharge Exam: Vitals:   09/03/16 0400 09/03/16 0500  BP: 121/84 110/76  Pulse: 68 96  Resp: 18 18  Temp: 98.4 F (36.9 C) 97.6 F (36.4 C)   Vitals:   09/03/16 0200 09/03/16 0300 09/03/16 0400 09/03/16 0500  BP: 122/77  121/84 110/76  Pulse: 76 70 68 96  Resp: _0 Temp:   98.4 F (36.9 C) 97.6 F (36.4 C)  TempSrc:   Oral Oral  SpO2: 99% 98% 98% 100%  Weight:      Height:        General: Pt is alert, awake, not in acute distress Cardiovascular: RRR, S1/S2 +, no rubs, no gallops Respiratory: CTA bilaterally, no wheezing, no rhonchi Abdominal: Soft, NT, ND, bowel sounds + Extremities: no edema, no cyanosis    The results of significant diagnostics from this hospitalization (including imaging, microbiology, ancillary and laboratory) are listed below for reference.     Microbiology: Recent Results (from the past 240 hour(s))  MRSA PCR Screening     Status: None   Collection Time: 09/02/16 11:34 PM  Result Value Ref Range Status   MRSA by PCR NEGATIVE NEGATIVE Final    Comment:        The GeneXpert MRSA Assay (FDA approved for NASAL specimens only), is one component of a comprehensive MRSA colonization surveillance program. It is not intended to diagnose MRSA infection nor to guide or monitor treatment for MRSA infections.      Labs: BNP (last 3 results) No results for input(s): BNP in the last 8760 hours. Basic Metabolic Panel:  Recent Labs Lab 09/02/16 2059 09/03/16 0105 09/03/16 0744  NA 132* 137 135  K 3.3* 2.9* 3.7  CL 96* 104 105  CO2 19* 23 22  GLUCOSE 392* 125* 164*  BUN _1 CREATININE 0.85 0.70 0.62  CALCIUM 9.4 8.7* 8.8*   Liver Function Tests: No results for input(s): AST, ALT, ALKPHOS, BILITOT, PROT, ALBUMIN in the last 168 hours. No results for  input(s): LIPASE, AMYLASE in the last 168 hours. No results for input(s): AMMONIA in the last 168 hours. CBC:  Recent Labs Lab 09/02/16 2059  WBC 10.7*  NEUTROABS 7.9*  HGB 13.6  HCT 37.3  MCV 85.6  PLT 288   Cardiac Enzymes: No results for input(s): CKTOTAL, CKMB, CKMBINDEX, TROPONINI in the last 168 hours. BNP: Invalid input(s): POCBNP CBG:  Recent Labs Lab 09/03/16 0303 09/03/16 0404 09/03/16 0529 09/03/16 0757 09/03/16 1237  GLUCAP 133* 139* 118* 150* 158*   D-Dimer No results for input(s): DDIMER in the last 72 hours. Hgb A1c No results for input(s): HGBA1C in the last  72 hours. Lipid Profile No results for input(s): CHOL, HDL, LDLCALC, TRIG, CHOLHDL, LDLDIRECT in the last 72 hours. Thyroid function studies No results for input(s): TSH, T4TOTAL, T3FREE, THYROIDAB in the last 72 hours.  Invalid input(s): FREET3 Anemia work up No results for input(s): VITAMINB12, FOLATE, FERRITIN, TIBC, IRON, RETICCTPCT in the last 72 hours. Urinalysis    Component Value Date/Time   COLORURINE YELLOW 09/02/2016 2100   APPEARANCEUR CLEAR 09/02/2016 2100   LABSPEC 1.041 (H) 09/02/2016 2100   PHURINE 5.5 09/02/2016 2100   GLUCOSEU >=500 (A) 09/02/2016 2100   HGBUR NEGATIVE 09/02/2016 2100   BILIRUBINUR NEGATIVE 09/02/2016 2100   KETONESUR >80 (A) 09/02/2016 2100   PROTEINUR NEGATIVE 09/02/2016 2100   NITRITE NEGATIVE 09/02/2016 2100   LEUKOCYTESUR TRACE (A) 09/02/2016 2100   Sepsis Labs Invalid input(s): PROCALCITONIN,  WBC,  LACTICIDVEN Microbiology Recent Results (from the past 240 hour(s))  MRSA PCR Screening     Status: None   Collection Time: 09/02/16 11:34 PM  Result Value Ref Range Status   MRSA by PCR NEGATIVE NEGATIVE Final    Comment:        The GeneXpert MRSA Assay (FDA approved for NASAL specimens only), is one component of a comprehensive MRSA colonization surveillance program. It is not intended to diagnose MRSA infection nor to guide or monitor  treatment for MRSA infections.      Time coordinating discharge: Over 30 minutes  SIGNED:   Elmarie Shiley, MD  [facility] 09/03/2016, 2:57 PM Pager   If 7PM-7AM, please contact night-coverage [email or web address] Password [email or web address]

## 2016-09-03 NOTE — Progress Notes (Signed)
Patient d/c home. Stable. 

## 2016-09-03 NOTE — Progress Notes (Signed)
Multiple care notes on diabetes provided to patient,verbalized understanding.

## 2016-09-04 DIAGNOSIS — E1165 Type 2 diabetes mellitus with hyperglycemia: Secondary | ICD-10-CM | POA: Diagnosis not present

## 2016-09-04 LAB — HEMOGLOBIN A1C
Hgb A1c MFr Bld: 11.1 % — ABNORMAL HIGH (ref 4.8–5.6)
Mean Plasma Glucose: 272 mg/dL

## 2016-09-05 DIAGNOSIS — Z23 Encounter for immunization: Secondary | ICD-10-CM | POA: Diagnosis not present

## 2016-09-05 DIAGNOSIS — E1065 Type 1 diabetes mellitus with hyperglycemia: Secondary | ICD-10-CM | POA: Diagnosis not present

## 2016-09-05 DIAGNOSIS — Z794 Long term (current) use of insulin: Secondary | ICD-10-CM | POA: Diagnosis not present

## 2016-09-05 DIAGNOSIS — E876 Hypokalemia: Secondary | ICD-10-CM | POA: Diagnosis not present

## 2016-09-06 ENCOUNTER — Encounter (HOSPITAL_COMMUNITY): Payer: Self-pay | Admitting: Radiology

## 2016-09-25 DIAGNOSIS — Z794 Long term (current) use of insulin: Secondary | ICD-10-CM | POA: Diagnosis not present

## 2016-09-25 DIAGNOSIS — E1065 Type 1 diabetes mellitus with hyperglycemia: Secondary | ICD-10-CM | POA: Diagnosis not present

## 2016-10-02 ENCOUNTER — Encounter: Payer: BLUE CROSS/BLUE SHIELD | Attending: Internal Medicine | Admitting: *Deleted

## 2016-10-02 DIAGNOSIS — E1010 Type 1 diabetes mellitus with ketoacidosis without coma: Secondary | ICD-10-CM | POA: Diagnosis not present

## 2016-10-02 DIAGNOSIS — E1065 Type 1 diabetes mellitus with hyperglycemia: Secondary | ICD-10-CM | POA: Insufficient documentation

## 2016-10-02 DIAGNOSIS — Z713 Dietary counseling and surveillance: Secondary | ICD-10-CM | POA: Diagnosis not present

## 2016-10-02 DIAGNOSIS — E109 Type 1 diabetes mellitus without complications: Secondary | ICD-10-CM

## 2016-10-04 DIAGNOSIS — E1065 Type 1 diabetes mellitus with hyperglycemia: Secondary | ICD-10-CM | POA: Diagnosis not present

## 2016-10-08 NOTE — Patient Instructions (Signed)
Plan:  You are competent with your carb counting, you now realize you do not have to aim for a specific number of carbs as Dr> Buddy Duty has given you a Carb Ratio of 1 unit/15 grams so you can adjust insulin to what you choose to eat.  Include protein in moderation with your meals and snacks Continue reading food labels for Total Carbohydrate of foods Continue with your activity level daily as tolerated Continue checking BG at alternate times per day as directed by MD  Continue taking medication as directed by MD Once your Omni Pod insulin pump arrives, you may be trained by the [employer] trainer or by me. If you come here, call this office once you know the pump is being shipped so we can set up your training date.  I have informed you of our DM 1/Pump Support Group. This is a great opportunity to meet others with DM 1 and provide support to you as well as ongoing education.

## 2016-10-08 NOTE — Progress Notes (Signed)
Diabetes Self-Management Education  Visit Type: First/Initial  Appt. Start Time: 1530 Appt. End Time: 2458  10/08/2016  [name], identified by name and date of birth, is a 21 y.o. female with a diagnosis of Diabetes: Type 1. She is here with her mother and father who both participated in the visit. 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 She states since going to college she has been eating healthier, often following Paleo diet and is considering moving towards vegetarian diet. She enjoys exercising aerobically at gym, sometimes twice a day if schedule permits. She was educated in the hospital when she was diagnosed and demonstrates good understanding of information taught there. She has educated herself further since discharge. She has 2 part time jobs in [place] starting Aug. 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 She has pursued starting on Omni Pod insulin pump which she prefers as it has no tubing. She enjoys reading, tanning and tv along with playing softball competitively in high school.   ASSESSMENT  There were no vitals taken for this visit. Patient declined There is no height or weight on file to calculate BMI.      Diabetes Self-Management Education - 10/02/16 1535      Visit Information   Visit Type First/Initial     Initial Visit   Diabetes Type Type 1   Are you currently following a meal plan? Yes   What type of meal plan do you follow? carb modified   Are you taking your medications as prescribed? Yes   Date Diagnosed 09/02/2016     Health Coping   How would you rate your overall health? Good     Psychosocial Assessment   Patient Belief/Attitude about Diabetes Motivated to manage diabetes   Self-care barriers None   Other persons present Patient   Patient Concerns Glycemic Control;Weight Control  Expressed concern over quick weight gain once she started on insulin several times  during visit   Special Needs None   Learning Readiness Change in progress   How often do you need to have someone help you when you read instructions, pamphlets, or other written materials from your doctor or pharmacy? 1 - Never   What is the last grade level you completed in school? sophomore in college currently     Pre-Education Assessment   Patient understands the diabetes disease and treatment process. Needs Review   Patient understands incorporating nutritional management into lifestyle. Needs Review   Patient undertands incorporating physical activity into lifestyle. Demonstrates understanding / competency  may be excessive at times   Patient understands using medications safely. Demonstrates understanding / competency  plans to obtain Omni Pod insulin pump ASAP   Patient understands monitoring blood glucose, interpreting and using results Needs Review   Patient understands prevention, detection, and treatment of acute complications. Needs Review   Patient understands prevention, detection, and treatment of chronic complications. Needs Review   Patient understands how to develop strategies to address psychosocial issues. Needs Review     Complications   Last HgB A1C per patient/outside source 11.1 %  upon diagnosis one month ago   How often do you check your blood sugar? > 4 times/day   Number of hypoglycemic episodes per month 0   Have you had a dilated eye exam in the past 12 months? No   Have you had a dental exam in the past 12 months? No   Are  you checking your feet? Yes   How many days per week are you checking your feet? 2     Dietary Intake   Breakfast plain oatmeal with eggs and feta cheese   Lunch meat, cheese, large salad with chic peas   Dinner similar to lunch     Exercise   Exercise Type Moderate (swimming / aerobic walking)   How many days per week to you exercise? 7   How many minutes per day do you exercise? 45   Total minutes per week of exercise 315      Patient Education   Previous Diabetes Education No   Disease state  Definition of diabetes, type 1 and 2, and the diagnosis of diabetes  I explained physiology of weight loss and then weight retuning when insulin was provided. encouraged her not to restrict food to keep insulin doses lower as weight control method   Nutrition management  Role of diet in the treatment of diabetes and the relationship between the three main macronutrients and blood glucose level;Food label reading, portion sizes and measuring food.;Carbohydrate counting;Reviewed blood glucose goals for pre and post meals and how to evaluate the patients' food intake on their blood glucose level.   Physical activity and exercise  Role of exercise on diabetes management, blood pressure control and cardiac health.   Chronic complications Relationship between chronic complications and blood glucose control   Psychosocial adjustment Worked with patient to identify barriers to care and solutions;Role of stress on diabetes;Helped patient identify a support system for diabetes management     Individualized Goals (developed by patient)   Nutrition General guidelines for healthy choices and portions discussed  addressed that she doesn't have to eat specific number of carbs per meal but can adjust insulin to carbs she chooses to eat   Physical Activity Exercise 3-5 times per week   Medications take my medication as prescribed   Monitoring  test blood glucose pre and post meals as discussed   Reducing Risk examine blood glucose patterns     Post-Education Assessment   Patient understands the diabetes disease and treatment process. Demonstrates understanding / competency   Patient understands incorporating nutritional management into lifestyle. Demonstrates understanding / competency   Patient undertands incorporating physical activity into lifestyle. Demonstrates understanding / competency   Patient understands using medications safely.  Demonstrates understanding / competency   Patient understands monitoring blood glucose, interpreting and using results Demonstrates understanding / competency   Patient understands prevention, detection, and treatment of acute complications. Demonstrates understanding / competency   Patient understands prevention, detection, and treatment of chronic complications. Demonstrates understanding / competency   Patient understands how to develop strategies to address psychosocial issues. Needs Review   Patient understands how to develop strategies to promote health/change behavior. Demonstrates understanding / competency     Outcomes   Expected Outcomes Demonstrated interest in learning. Expect positive outcomes   Future DMSE PRN   Program Status Completed      Individualized Plan for Diabetes Self-Management Training:   Learning Objective:  Patient will have a greater understanding of diabetes self-management. Patient education plan is to attend individual and/or group sessions per assessed needs and concerns.   Plan:   Patient Instructions  Plan:  You are competent with your carb counting, you now realize you do not have to aim for a specific number of carbs as Dr> Buddy Duty has given you a Carb Ratio of 1 unit/15 grams so you can adjust insulin to what you  choose to eat.  Include protein in moderation with your meals and snacks Continue reading food labels for Total Carbohydrate of foods Continue with your activity level daily as tolerated Continue checking BG at alternate times per day as directed by MD  Continue taking medication as directed by MD Once your Omni Pod insulin pump arrives, you may be trained by the [employer] trainer or by me. If you come here, call this office once you know the pump is being shipped so we can set up your training date.  I have informed you of our DM 1/Pump Support Group. This is a great opportunity to meet others with DM 1 and provide support to you as well as  ongoing education.   Expected Outcomes:  Demonstrated interest in learning. Expect positive outcomes  Education material provided: Living Well with Diabetes, Food label handouts, Meal plan card, Support group flyer and Carbohydrate counting sheet  If problems or questions, patient to contact team via:  Phone  Future DSME appointment: PRN

## 2016-11-27 DIAGNOSIS — Z794 Long term (current) use of insulin: Secondary | ICD-10-CM | POA: Diagnosis not present

## 2016-11-27 DIAGNOSIS — E109 Type 1 diabetes mellitus without complications: Secondary | ICD-10-CM | POA: Diagnosis not present

## 2016-12-06 DIAGNOSIS — H52223 Regular astigmatism, bilateral: Secondary | ICD-10-CM | POA: Diagnosis not present

## 2016-12-08 DIAGNOSIS — J029 Acute pharyngitis, unspecified: Secondary | ICD-10-CM | POA: Diagnosis not present

## 2016-12-08 DIAGNOSIS — R070 Pain in throat: Secondary | ICD-10-CM | POA: Diagnosis not present

## 2017-01-02 DIAGNOSIS — E1065 Type 1 diabetes mellitus with hyperglycemia: Secondary | ICD-10-CM | POA: Diagnosis not present

## 2017-01-06 DIAGNOSIS — E1065 Type 1 diabetes mellitus with hyperglycemia: Secondary | ICD-10-CM | POA: Diagnosis not present

## 2017-02-28 DIAGNOSIS — L700 Acne vulgaris: Secondary | ICD-10-CM | POA: Diagnosis not present

## 2017-02-28 DIAGNOSIS — E109 Type 1 diabetes mellitus without complications: Secondary | ICD-10-CM | POA: Diagnosis not present

## 2017-02-28 DIAGNOSIS — Z794 Long term (current) use of insulin: Secondary | ICD-10-CM | POA: Diagnosis not present

## 2017-03-31 DIAGNOSIS — E1065 Type 1 diabetes mellitus with hyperglycemia: Secondary | ICD-10-CM | POA: Diagnosis not present

## 2017-04-07 ENCOUNTER — Other Ambulatory Visit: Payer: BLUE CROSS/BLUE SHIELD

## 2017-04-08 DIAGNOSIS — E1065 Type 1 diabetes mellitus with hyperglycemia: Secondary | ICD-10-CM | POA: Diagnosis not present

## 2017-04-09 ENCOUNTER — Ambulatory Visit
Admission: RE | Admit: 2017-04-09 | Discharge: 2017-04-09 | Disposition: A | Discharge status: Home / Self Care | Payer: BLUE CROSS/BLUE SHIELD | Source: Ambulatory Visit | Attending: Nurse Practitioner | Admitting: Nurse Practitioner

## 2017-04-09 ENCOUNTER — Other Ambulatory Visit: Payer: Self-pay | Admitting: Nurse Practitioner

## 2017-04-09 DIAGNOSIS — N6489 Other specified disorders of breast: Secondary | ICD-10-CM | POA: Diagnosis not present

## 2017-04-09 DIAGNOSIS — N6320 Unspecified lump in the left breast, unspecified quadrant: Secondary | ICD-10-CM

## 2017-05-26 DIAGNOSIS — Z794 Long term (current) use of insulin: Secondary | ICD-10-CM | POA: Diagnosis not present

## 2017-05-26 DIAGNOSIS — E109 Type 1 diabetes mellitus without complications: Secondary | ICD-10-CM | POA: Diagnosis not present

## 2017-06-12 DIAGNOSIS — J029 Acute pharyngitis, unspecified: Secondary | ICD-10-CM | POA: Diagnosis not present

## 2017-06-12 DIAGNOSIS — J069 Acute upper respiratory infection, unspecified: Secondary | ICD-10-CM | POA: Diagnosis not present

## 2017-06-30 DIAGNOSIS — Z6822 Body mass index (BMI) 22.0-22.9, adult: Secondary | ICD-10-CM | POA: Diagnosis not present

## 2017-06-30 DIAGNOSIS — Z113 Encounter for screening for infections with a predominantly sexual mode of transmission: Secondary | ICD-10-CM | POA: Diagnosis not present

## 2017-06-30 DIAGNOSIS — Z01419 Encounter for gynecological examination (general) (routine) without abnormal findings: Secondary | ICD-10-CM | POA: Diagnosis not present

## 2017-07-02 DIAGNOSIS — E109 Type 1 diabetes mellitus without complications: Secondary | ICD-10-CM | POA: Diagnosis not present

## 2017-07-02 DIAGNOSIS — E1065 Type 1 diabetes mellitus with hyperglycemia: Secondary | ICD-10-CM | POA: Diagnosis not present

## 2017-07-02 DIAGNOSIS — Z794 Long term (current) use of insulin: Secondary | ICD-10-CM | POA: Diagnosis not present

## 2017-07-03 DIAGNOSIS — E109 Type 1 diabetes mellitus without complications: Secondary | ICD-10-CM | POA: Diagnosis not present

## 2017-07-03 DIAGNOSIS — Z794 Long term (current) use of insulin: Secondary | ICD-10-CM | POA: Diagnosis not present

## 2017-07-07 DIAGNOSIS — E1065 Type 1 diabetes mellitus with hyperglycemia: Secondary | ICD-10-CM | POA: Diagnosis not present

## 2017-09-17 DIAGNOSIS — R070 Pain in throat: Secondary | ICD-10-CM | POA: Diagnosis not present

## 2017-09-26 DIAGNOSIS — E1065 Type 1 diabetes mellitus with hyperglycemia: Secondary | ICD-10-CM | POA: Diagnosis not present

## 2017-10-07 DIAGNOSIS — E1065 Type 1 diabetes mellitus with hyperglycemia: Secondary | ICD-10-CM | POA: Diagnosis not present

## 2017-10-16 DIAGNOSIS — Z794 Long term (current) use of insulin: Secondary | ICD-10-CM | POA: Diagnosis not present

## 2017-10-16 DIAGNOSIS — E109 Type 1 diabetes mellitus without complications: Secondary | ICD-10-CM | POA: Diagnosis not present

## 2017-11-11 DIAGNOSIS — R112 Nausea with vomiting, unspecified: Secondary | ICD-10-CM | POA: Diagnosis not present

## 2017-11-11 DIAGNOSIS — N390 Urinary tract infection, site not specified: Secondary | ICD-10-CM | POA: Diagnosis not present

## 2017-11-11 DIAGNOSIS — E119 Type 2 diabetes mellitus without complications: Secondary | ICD-10-CM | POA: Diagnosis not present

## 2017-12-11 DIAGNOSIS — R8290 Unspecified abnormal findings in urine: Secondary | ICD-10-CM | POA: Diagnosis not present

## 2017-12-11 DIAGNOSIS — N390 Urinary tract infection, site not specified: Secondary | ICD-10-CM | POA: Diagnosis not present

## 2017-12-12 DIAGNOSIS — E109 Type 1 diabetes mellitus without complications: Secondary | ICD-10-CM | POA: Diagnosis not present

## 2017-12-15 DIAGNOSIS — E109 Type 1 diabetes mellitus without complications: Secondary | ICD-10-CM | POA: Diagnosis not present

## 2018-01-05 DIAGNOSIS — E1065 Type 1 diabetes mellitus with hyperglycemia: Secondary | ICD-10-CM | POA: Diagnosis not present

## 2018-01-07 DIAGNOSIS — E1065 Type 1 diabetes mellitus with hyperglycemia: Secondary | ICD-10-CM | POA: Diagnosis not present

## 2018-01-23 DIAGNOSIS — E109 Type 1 diabetes mellitus without complications: Secondary | ICD-10-CM | POA: Diagnosis not present

## 2018-01-23 DIAGNOSIS — N390 Urinary tract infection, site not specified: Secondary | ICD-10-CM | POA: Diagnosis not present

## 2018-01-30 DIAGNOSIS — Z87440 Personal history of urinary (tract) infections: Secondary | ICD-10-CM | POA: Diagnosis not present

## 2018-01-30 DIAGNOSIS — Z794 Long term (current) use of insulin: Secondary | ICD-10-CM | POA: Diagnosis not present

## 2018-01-30 DIAGNOSIS — E109 Type 1 diabetes mellitus without complications: Secondary | ICD-10-CM | POA: Diagnosis not present

## 2018-03-21 DIAGNOSIS — E119 Type 2 diabetes mellitus without complications: Secondary | ICD-10-CM | POA: Diagnosis not present

## 2018-03-21 DIAGNOSIS — R300 Dysuria: Secondary | ICD-10-CM | POA: Diagnosis not present

## 2018-03-24 DIAGNOSIS — R110 Nausea: Secondary | ICD-10-CM | POA: Diagnosis not present

## 2018-04-06 DIAGNOSIS — E1065 Type 1 diabetes mellitus with hyperglycemia: Secondary | ICD-10-CM | POA: Diagnosis not present

## 2018-04-08 DIAGNOSIS — E1065 Type 1 diabetes mellitus with hyperglycemia: Secondary | ICD-10-CM | POA: Diagnosis not present

## 2018-04-10 ENCOUNTER — Other Ambulatory Visit: Payer: Self-pay | Admitting: Nurse Practitioner

## 2018-04-10 ENCOUNTER — Other Ambulatory Visit: Payer: BLUE CROSS/BLUE SHIELD

## 2018-04-10 DIAGNOSIS — N6320 Unspecified lump in the left breast, unspecified quadrant: Secondary | ICD-10-CM

## 2018-04-23 DIAGNOSIS — Z794 Long term (current) use of insulin: Secondary | ICD-10-CM | POA: Diagnosis not present

## 2018-04-23 DIAGNOSIS — E109 Type 1 diabetes mellitus without complications: Secondary | ICD-10-CM | POA: Diagnosis not present

## 2018-04-23 DIAGNOSIS — E1165 Type 2 diabetes mellitus with hyperglycemia: Secondary | ICD-10-CM | POA: Diagnosis not present

## 2018-05-11 DIAGNOSIS — L700 Acne vulgaris: Secondary | ICD-10-CM | POA: Diagnosis not present

## 2018-06-25 DIAGNOSIS — Z713 Dietary counseling and surveillance: Secondary | ICD-10-CM | POA: Diagnosis not present

## 2018-06-29 ENCOUNTER — Other Ambulatory Visit: Payer: Self-pay | Admitting: Obstetrics & Gynecology

## 2018-06-29 DIAGNOSIS — N6320 Unspecified lump in the left breast, unspecified quadrant: Secondary | ICD-10-CM

## 2018-06-30 ENCOUNTER — Ambulatory Visit
Admission: RE | Admit: 2018-06-30 | Discharge: 2018-06-30 | Disposition: A | Discharge status: Home / Self Care | Payer: BLUE CROSS/BLUE SHIELD | Source: Ambulatory Visit | Attending: Nurse Practitioner | Admitting: Nurse Practitioner

## 2018-06-30 DIAGNOSIS — D242 Benign neoplasm of left breast: Secondary | ICD-10-CM | POA: Diagnosis not present

## 2018-06-30 DIAGNOSIS — N6320 Unspecified lump in the left breast, unspecified quadrant: Secondary | ICD-10-CM

## 2018-07-03 ENCOUNTER — Encounter (HOSPITAL_COMMUNITY): Payer: Self-pay

## 2018-07-03 ENCOUNTER — Other Ambulatory Visit: Payer: Self-pay

## 2018-07-03 ENCOUNTER — Ambulatory Visit (HOSPITAL_COMMUNITY)
Admission: EM | Admit: 2018-07-03 | Discharge: 2018-07-03 | Disposition: A | Discharge status: Home / Self Care | Payer: BLUE CROSS/BLUE SHIELD | Attending: Emergency Medicine | Admitting: Emergency Medicine

## 2018-07-03 DIAGNOSIS — J020 Streptococcal pharyngitis: Secondary | ICD-10-CM | POA: Diagnosis not present

## 2018-07-03 HISTORY — DX: Type 2 diabetes mellitus without complications: E11.9

## 2018-07-03 LAB — POCT RAPID STREP A: Streptococcus, Group A Screen (Direct): POSITIVE — AB

## 2018-07-03 LAB — POCT INFLUENZA A/B
Influenza A, POC: NEGATIVE
Influenza B, POC: NEGATIVE

## 2018-07-03 MED ORDER — AMOXICILLIN 500 MG PO CAPS: 500.0000 mg | ORAL_CAPSULE | Freq: Two times a day (BID) | ORAL | 0 refills | Status: AC

## 2018-07-03 NOTE — ED Triage Notes (Signed)
Pt presents with ongoing sore throat, fever, and headache for about a week.

## 2018-07-03 NOTE — Discharge Instructions (Addendum)
Strep was positive.  Drink plenty of water and rest Prescribed amoxicillin 500mg  twice daily for 10 days.  Take as directed and to completion.  Perform salt water gargles at home, throat lozenges Take OTC tylenol and/or ibuprofen every 4-6 hours as needed for pain and fever Follow up with PCP if symptoms persist Return or go to ER if you have any new or worsening symptoms continued fever, sinus pain, rhinorrhea, shortness of breath, wheezing, chest pain, cough, nausea, changes in bowel or bladder habits, etc..Marland Kitchen

## 2018-07-03 NOTE — ED Provider Notes (Signed)
[place]   [id number] 07/03/18 Arrival Time: 6160   CC: Sore throat  SUBJECTIVE: History from: patient.  Chelsea Rollins is a 23 y.o. female who presents with abrupt onset of sore throat x 1 day.  Pt also mentions recent URI symptoms, including runny nose, congestion, and cough, that resolved earlier this week after returning from [place] on 06/21/2018.  Denies known sick exposure or precipitating event.  Has tried OTC medications with minimal relief.  Symptoms are made worse with swallowing, but tolerating own secretions and liquids without difficulty.  Reports previous symptoms in the past and diagnosed with tonsilitis.   Denies sinus pain, rhinorrhea, SOB, wheezing, chest pain, nausea, changes in bowel or bladder habits.    ROS: As per HPI.  Past Medical History:  Diagnosis Date  . Diabetes mellitus without complication ([place])   . VPXTGGYI(948.5)    Past Surgical History:  Procedure Laterality Date  . SHOULDER SURGERY  05/12/13   right shoulder- torn labrum(loose ligaments)  . WISDOM TOOTH EXTRACTION     No Known Allergies No current facility-administered medications on file prior to encounter.    Current Outpatient Medications on File Prior to Encounter  Medication Sig Dispense Refill  . blood glucose meter kit and supplies Check blood sugar three times a day before meals and at bedtime. 1 each 0  . insulin aspart (FIASP FLEXTOUCH) 100 UNIT/ML FlexPen Inject into the skin 3 (three) times daily with meals.    . insulin degludec (TRESIBA FLEXTOUCH) 100 UNIT/ML SOPN FlexTouch Pen Inject 14 Units into the skin daily at 10 pm.    . Insulin Detemir (LEVEMIR) 100 UNIT/ML Pen Inject 7 Units into the skin daily at 10 pm. (Patient not taking: Reported on 10/02/2016) 15 mL 11  . Insulin Pen Needle 31G X 5 MM MISC 1 application by Does not apply route daily. 30 each 0  . ISIBLOOM 0.15-30 MG-MCG tablet Take 1 tablet by mouth daily.  3  . Multiple Vitamin (MULTIVITAMIN) capsule  Take 1 capsule by mouth daily.    . valACYclovir (VALTREX) 500 MG tablet Take 500 mg by mouth daily.  11   Social History   Socioeconomic History  . Marital status: Single    Spouse name: Not on file  . Number of children: Not on file  . Years of education: Not on file  . Highest education level: Not on file  Occupational History  . Occupation: Ship broker  Social Needs  . Financial resource strain: Not on file  . Food insecurity:    Worry: Not on file    Inability: Not on file  . Transportation needs:    Medical: Not on file    Non-medical: Not on file  Tobacco Use  . Smoking status: Never Smoker  . Smokeless tobacco: Never Used  Substance and Sexual Activity  . Alcohol use: No  . Drug use: No  . Sexual activity: Not on file  Lifestyle  . Physical activity:    Days per week: Not on file    Minutes per session: Not on file  . Stress: Not on file  Relationships  . Social connections:    Talks on phone: Not on file    Gets together: Not on file    Attends religious service: Not on file    Active member of club or organization: Not on file    Attends meetings of clubs or organizations: Not on file    Relationship status: Not on file  . Intimate  partner violence:    Fear of current or ex partner: Not on file    Emotionally abused: Not on file    Physically abused: Not on file    Forced sexual activity: Not on file  Other Topics Concern  . Not on file  Social History Narrative  . Not on file   Family History  Problem Relation Age of Onset  . Hypertension Father   . Diabetes Mellitus II Father   . Heart Problems Maternal Grandfather        Died at 27  . Diabetes Maternal Grandfather   . Heart Problems Paternal Grandmother        Died at 71  . Heart Problems Paternal Grandfather        Died at 52  . Diabetes Other     OBJECTIVE:  Vitals:   07/03/18 1634  BP: 131/84  Pulse: (!) 137  Resp: 18  Temp: (!) 102.7 F (39.3 C)  TempSrc: Oral  SpO2: 95%      General appearance: alert; appears fatigued, but nontoxic; speaking in full sentences and tolerating own secretions HEENT: NCAT; Ears: EACs clear, TMs pearly gray; Eyes: PERRL.  EOM grossly intact. Nose: nares patent without rhinorrhea, Throat: oropharynx clear, tonsils erythematous AND enlarged 2+, uvula midline  Neck: supple without LAD Lungs: CTAB Heart: regular rate and rhythm.  Radial pulses 2+ symmetrical bilaterally Skin: warm and dry Psychological: alert and cooperative; normal mood and affect  LABS:  Results for orders placed or performed during the hospital encounter of 07/03/18 (from the past 24 hour(s))  POCT Influenza A/B     Status: Normal   Collection Time: 07/03/18  5:06 PM  Result Value Ref Range   Influenza A, POC Negative Negative   Influenza B, POC Negative Negative  POCT rapid strep A [facility])     Status: Abnormal   Collection Time: 07/03/18  5:26 PM  Result Value Ref Range   Streptococcus, Group A Screen (Direct) POSITIVE (A) NEGATIVE     ASSESSMENT & PLAN:  1. Strep pharyngitis     Meds ordered this encounter  Medications  . amoxicillin (AMOXIL) 500 MG capsule    Sig: Take 1 capsule (500 mg total) by mouth 2 (two) times daily for 10 days.    Dispense:  20 capsule    Refill:  0    Order Specific Question:   Supervising Provider    Answer:   Raylene Everts [[id number]]    Strep was positive.  Drink plenty of water and rest Prescribed amoxicillin '500mg'$  twice daily for 10 days.  Take as directed and to completion.  Perform salt water gargles at home, throat lozenges Take OTC tylenol and/or ibuprofen every 4-6 hours as needed for pain and fever Follow up with PCP if symptoms persist Return or go to ER if you have any new or worsening symptoms continued fever, sinus pain, rhinorrhea, shortness of breath, wheezing, chest pain, cough, nausea, changes in bowel or bladder habits, etc...  Reviewed expectations re: course of current medical issues.  Questions answered. Outlined signs and symptoms indicating need for more acute intervention. Patient verbalized understanding. After Visit Summary given.         Lestine Box, PA-C 07/03/18 1822

## 2018-07-06 DIAGNOSIS — E1065 Type 1 diabetes mellitus with hyperglycemia: Secondary | ICD-10-CM | POA: Diagnosis not present

## 2018-07-08 DIAGNOSIS — E1065 Type 1 diabetes mellitus with hyperglycemia: Secondary | ICD-10-CM | POA: Diagnosis not present

## 2018-08-04 DIAGNOSIS — Z01419 Encounter for gynecological examination (general) (routine) without abnormal findings: Secondary | ICD-10-CM | POA: Diagnosis not present

## 2018-08-04 DIAGNOSIS — A609 Anogenital herpesviral infection, unspecified: Secondary | ICD-10-CM | POA: Diagnosis not present

## 2018-08-04 DIAGNOSIS — Z113 Encounter for screening for infections with a predominantly sexual mode of transmission: Secondary | ICD-10-CM | POA: Diagnosis not present

## 2018-08-04 DIAGNOSIS — Z6823 Body mass index (BMI) 23.0-23.9, adult: Secondary | ICD-10-CM | POA: Diagnosis not present

## 2018-09-21 DIAGNOSIS — Z111 Encounter for screening for respiratory tuberculosis: Secondary | ICD-10-CM | POA: Diagnosis not present

## 2018-09-21 DIAGNOSIS — Z0001 Encounter for general adult medical examination with abnormal findings: Secondary | ICD-10-CM | POA: Diagnosis not present

## 2018-09-21 DIAGNOSIS — Z23 Encounter for immunization: Secondary | ICD-10-CM | POA: Diagnosis not present

## 2018-10-06 DIAGNOSIS — E1065 Type 1 diabetes mellitus with hyperglycemia: Secondary | ICD-10-CM | POA: Diagnosis not present

## 2018-10-12 DIAGNOSIS — E109 Type 1 diabetes mellitus without complications: Secondary | ICD-10-CM | POA: Diagnosis not present

## 2018-10-12 DIAGNOSIS — Z794 Long term (current) use of insulin: Secondary | ICD-10-CM | POA: Diagnosis not present

## 2018-10-27 DIAGNOSIS — J020 Streptococcal pharyngitis: Secondary | ICD-10-CM | POA: Diagnosis not present

## 2018-12-25 DIAGNOSIS — E109 Type 1 diabetes mellitus without complications: Secondary | ICD-10-CM | POA: Diagnosis not present

## 2019-01-06 DIAGNOSIS — E1065 Type 1 diabetes mellitus with hyperglycemia: Secondary | ICD-10-CM | POA: Diagnosis not present

## 2019-03-26 DIAGNOSIS — Z794 Long term (current) use of insulin: Secondary | ICD-10-CM | POA: Diagnosis not present

## 2019-03-26 DIAGNOSIS — E1165 Type 2 diabetes mellitus with hyperglycemia: Secondary | ICD-10-CM | POA: Diagnosis not present

## 2019-03-26 DIAGNOSIS — E109 Type 1 diabetes mellitus without complications: Secondary | ICD-10-CM | POA: Diagnosis not present

## 2019-04-30 DIAGNOSIS — Z20828 Contact with and (suspected) exposure to other viral communicable diseases: Secondary | ICD-10-CM | POA: Diagnosis not present

## 2019-05-02 DIAGNOSIS — E1065 Type 1 diabetes mellitus with hyperglycemia: Secondary | ICD-10-CM | POA: Diagnosis not present

## 2019-05-22 DIAGNOSIS — N10 Acute tubulo-interstitial nephritis: Secondary | ICD-10-CM | POA: Diagnosis not present

## 2019-06-25 DIAGNOSIS — E1065 Type 1 diabetes mellitus with hyperglycemia: Secondary | ICD-10-CM | POA: Diagnosis not present

## 2019-07-08 DIAGNOSIS — N390 Urinary tract infection, site not specified: Secondary | ICD-10-CM | POA: Diagnosis not present

## 2019-07-26 DIAGNOSIS — E1065 Type 1 diabetes mellitus with hyperglycemia: Secondary | ICD-10-CM | POA: Diagnosis not present

## 2019-09-27 DIAGNOSIS — E1065 Type 1 diabetes mellitus with hyperglycemia: Secondary | ICD-10-CM | POA: Diagnosis not present

## 2019-09-29 DIAGNOSIS — Z113 Encounter for screening for infections with a predominantly sexual mode of transmission: Secondary | ICD-10-CM | POA: Diagnosis not present

## 2019-09-29 DIAGNOSIS — Z6824 Body mass index (BMI) 24.0-24.9, adult: Secondary | ICD-10-CM | POA: Diagnosis not present

## 2019-09-29 DIAGNOSIS — Z3040 Encounter for surveillance of contraceptives, unspecified: Secondary | ICD-10-CM | POA: Diagnosis not present

## 2019-09-29 DIAGNOSIS — Z01419 Encounter for gynecological examination (general) (routine) without abnormal findings: Secondary | ICD-10-CM | POA: Diagnosis not present

## 2019-10-04 DIAGNOSIS — E1165 Type 2 diabetes mellitus with hyperglycemia: Secondary | ICD-10-CM | POA: Diagnosis not present

## 2019-10-04 DIAGNOSIS — Z794 Long term (current) use of insulin: Secondary | ICD-10-CM | POA: Diagnosis not present

## 2019-10-04 DIAGNOSIS — E109 Type 1 diabetes mellitus without complications: Secondary | ICD-10-CM | POA: Diagnosis not present

## 2019-10-20 DIAGNOSIS — E1065 Type 1 diabetes mellitus with hyperglycemia: Secondary | ICD-10-CM | POA: Diagnosis not present

## 2019-12-12 DIAGNOSIS — Z20828 Contact with and (suspected) exposure to other viral communicable diseases: Secondary | ICD-10-CM | POA: Diagnosis not present

## 2019-12-24 DIAGNOSIS — E1065 Type 1 diabetes mellitus with hyperglycemia: Secondary | ICD-10-CM | POA: Diagnosis not present

## 2020-01-17 DIAGNOSIS — E1065 Type 1 diabetes mellitus with hyperglycemia: Secondary | ICD-10-CM | POA: Diagnosis not present

## 2020-02-01 DIAGNOSIS — Z20822 Contact with and (suspected) exposure to covid-19: Secondary | ICD-10-CM | POA: Diagnosis not present

## 2020-03-05 DIAGNOSIS — L03312 Cellulitis of back [any part except buttock]: Secondary | ICD-10-CM | POA: Diagnosis not present

## 2020-03-05 DIAGNOSIS — E109 Type 1 diabetes mellitus without complications: Secondary | ICD-10-CM | POA: Diagnosis not present

## 2020-03-09 DIAGNOSIS — L0291 Cutaneous abscess, unspecified: Secondary | ICD-10-CM | POA: Diagnosis not present

## 2020-04-09 IMAGING — US ULTRASOUND LEFT BREAST LIMITED
1 series · 4 of 4 positions shown · non-contrast
Comparison: Previous exam(s).

CLINICAL DATA: 2+ year interval follow-up of a likely benign
fibroadenoma involving the LOWER LEFT breast.

EXAM:
ULTRASOUND OF THE LEFT BREAST

[Series 1: ultrasound left breast limited · 0.06mm/px · 4 of 4 slices shown]
[im 1/4]
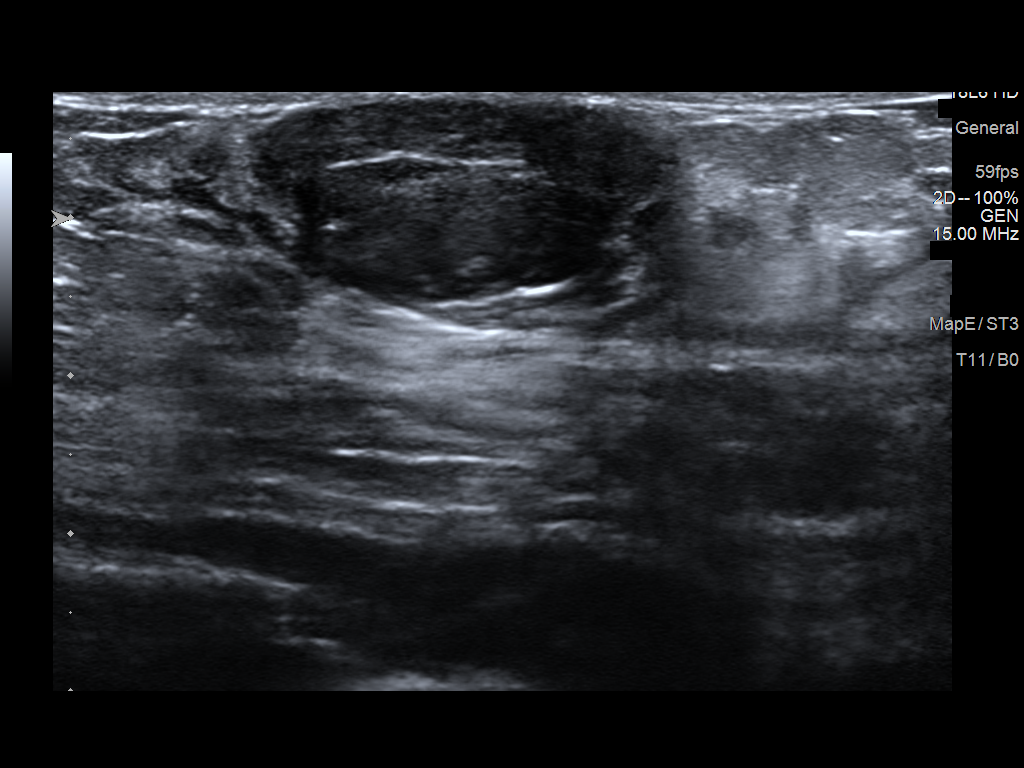
[im 2/4]
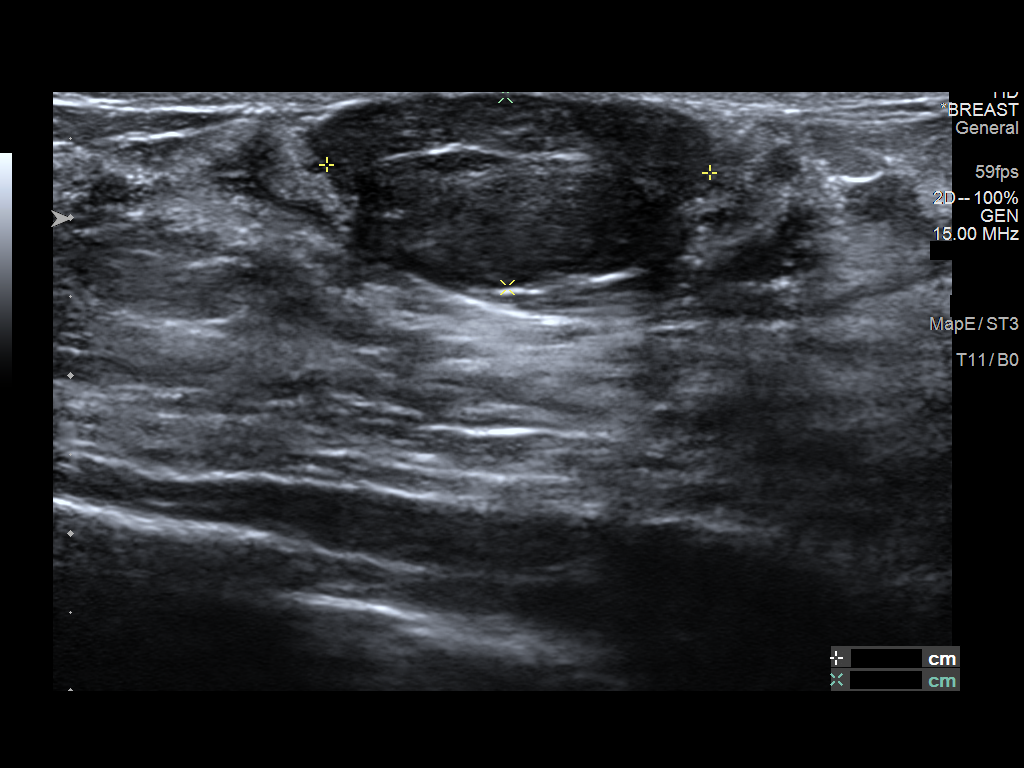
[im 3/4]
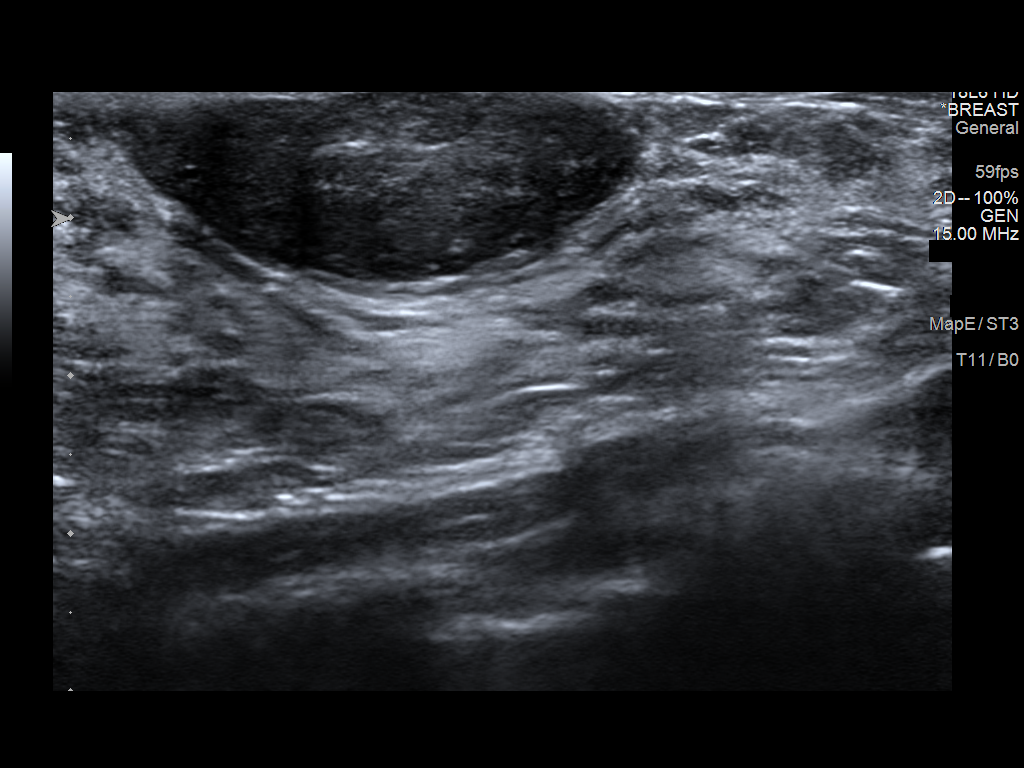
[im 4/4]
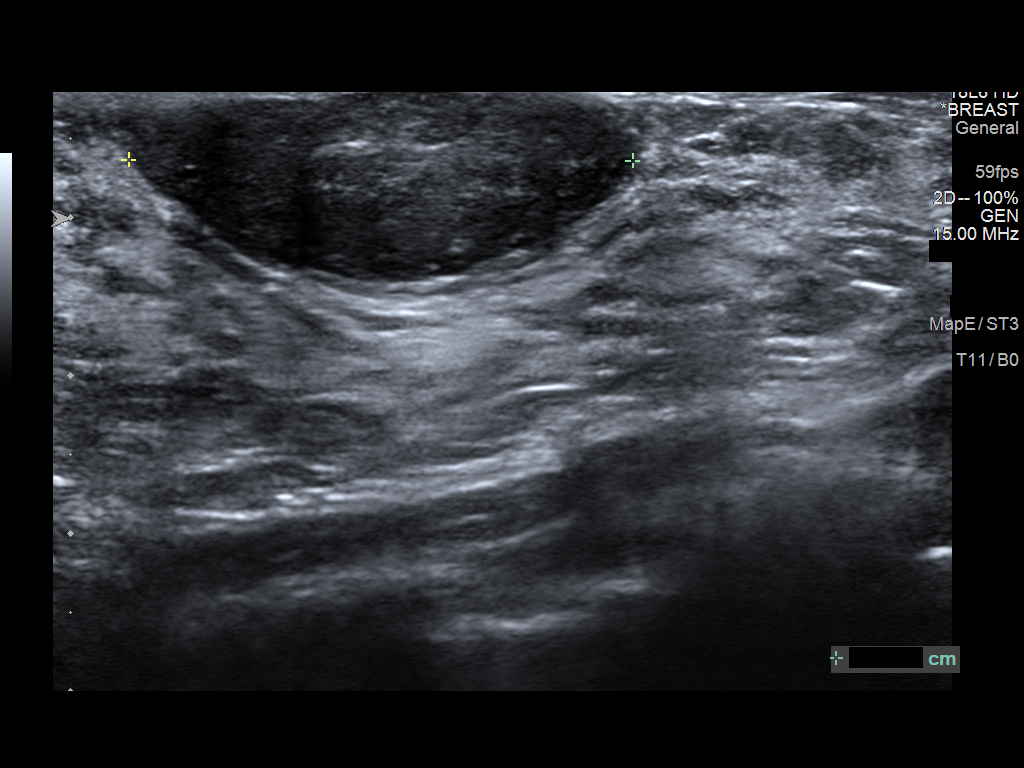

[4 of 4 positions shown; findings below may reference images not displayed]

FINDINGS: Targeted LEFT breast ultrasound is performed, showing the previously
identified circumscribed oval parallel hypoechoic mass superficially
at the 6 o'clock position approximately 1 cm from the nipple
measuring approximately 1.2 x 2.4 x 3.2 cm (previously 1.1 x 2.7 x
3.3 cm on the initial ultrasound in April 2016).
IMPRESSION: Stable benign approximate 3 cm fibroadenoma involving the LOWER LEFT
breast dating back to April 2016.

RECOMMENDATION:
As the LEFT breast mass has been stable for greater than 2 years, no
further imaging follow-up is necessary. Screening mammography is
recommended begin at age 40.

I have discussed the findings and recommendations with the patient.
Results were also provided in writing at the conclusion of the
visit.

BI-RADS CATEGORY  2: Benign.

## 2022-03-05 DIAGNOSIS — F5089 Other specified eating disorder: Secondary | ICD-10-CM | POA: Diagnosis not present

## 2022-03-12 DIAGNOSIS — F5089 Other specified eating disorder: Secondary | ICD-10-CM | POA: Diagnosis not present

## 2022-03-19 DIAGNOSIS — F5089 Other specified eating disorder: Secondary | ICD-10-CM | POA: Diagnosis not present

## 2022-04-02 DIAGNOSIS — F5089 Other specified eating disorder: Secondary | ICD-10-CM | POA: Diagnosis not present

## 2022-04-16 DIAGNOSIS — F5089 Other specified eating disorder: Secondary | ICD-10-CM | POA: Diagnosis not present

## 2022-04-30 DIAGNOSIS — F5089 Other specified eating disorder: Secondary | ICD-10-CM | POA: Diagnosis not present

## 2022-05-07 DIAGNOSIS — F5089 Other specified eating disorder: Secondary | ICD-10-CM | POA: Diagnosis not present

## 2022-05-14 DIAGNOSIS — F5089 Other specified eating disorder: Secondary | ICD-10-CM | POA: Diagnosis not present

## 2022-05-21 DIAGNOSIS — F5089 Other specified eating disorder: Secondary | ICD-10-CM | POA: Diagnosis not present

## 2022-05-24 DIAGNOSIS — E109 Type 1 diabetes mellitus without complications: Secondary | ICD-10-CM | POA: Diagnosis not present

## 2022-05-24 DIAGNOSIS — E063 Autoimmune thyroiditis: Secondary | ICD-10-CM | POA: Diagnosis not present

## 2022-05-24 DIAGNOSIS — Z794 Long term (current) use of insulin: Secondary | ICD-10-CM | POA: Diagnosis not present

## 2022-05-24 DIAGNOSIS — R635 Abnormal weight gain: Secondary | ICD-10-CM | POA: Diagnosis not present

## 2022-05-24 DIAGNOSIS — R140 Abdominal distension (gaseous): Secondary | ICD-10-CM | POA: Diagnosis not present

## 2022-05-28 DIAGNOSIS — F5089 Other specified eating disorder: Secondary | ICD-10-CM | POA: Diagnosis not present

## 2022-06-04 DIAGNOSIS — F5089 Other specified eating disorder: Secondary | ICD-10-CM | POA: Diagnosis not present

## 2022-06-11 DIAGNOSIS — F5089 Other specified eating disorder: Secondary | ICD-10-CM | POA: Diagnosis not present

## 2022-06-18 DIAGNOSIS — F5089 Other specified eating disorder: Secondary | ICD-10-CM | POA: Diagnosis not present

## 2023-10-23 ENCOUNTER — Other Ambulatory Visit (HOSPITAL_BASED_OUTPATIENT_CLINIC_OR_DEPARTMENT_OTHER): Payer: Self-pay

## 2023-10-25 ENCOUNTER — Other Ambulatory Visit (HOSPITAL_BASED_OUTPATIENT_CLINIC_OR_DEPARTMENT_OTHER): Payer: Self-pay

## 2023-10-27 ENCOUNTER — Other Ambulatory Visit (HOSPITAL_BASED_OUTPATIENT_CLINIC_OR_DEPARTMENT_OTHER): Payer: Self-pay

## 2023-10-27 MED ORDER — FIASP 100 UNIT/ML IJ SOLN
INTRAMUSCULAR | 2 refills | Status: DC
  Filled 2023-10-27: qty 20, 30d supply, fill #0

## 2024-03-14 ENCOUNTER — Other Ambulatory Visit (HOSPITAL_BASED_OUTPATIENT_CLINIC_OR_DEPARTMENT_OTHER): Payer: Self-pay

## 2024-03-16 ENCOUNTER — Encounter: Payer: Self-pay | Admitting: "Endocrinology

## 2024-03-24 ENCOUNTER — Encounter: Payer: Self-pay | Admitting: "Endocrinology

## 2024-03-24 ENCOUNTER — Ambulatory Visit: Admitting: "Endocrinology

## 2024-03-24 VITALS — BP 130/80 | HR 107 | Ht 64.0 in | Wt 165.0 lb

## 2024-03-24 DIAGNOSIS — Z4681 Encounter for fitting and adjustment of insulin pump: Secondary | ICD-10-CM

## 2024-03-24 DIAGNOSIS — E10649 Type 1 diabetes mellitus with hypoglycemia without coma: Secondary | ICD-10-CM

## 2024-03-24 DIAGNOSIS — Z9641 Presence of insulin pump (external) (internal): Secondary | ICD-10-CM

## 2024-03-24 LAB — POCT GLYCOSYLATED HEMOGLOBIN (HGB A1C): Hemoglobin A1C: 5.6 % (ref 4.0–5.6)

## 2024-03-24 MED ORDER — BAQSIMI ONE PACK 3 MG/DOSE NA POWD: 1.0000 | NASAL | 3 refills | Status: AC | PRN

## 2024-03-24 MED ORDER — FIASP 100 UNIT/ML IJ SOLN: INTRAMUSCULAR | 2 refills | Status: AC

## 2024-03-24 NOTE — Patient Instructions (Signed)

## 2024-03-24 NOTE — Progress Notes (Signed)
 Outpatient Endocrinology Note Chelsea Birmingham, MD  03/24/24   [name] Apr 06, 1996 [id number]  Referring Provider: Jess Erasmo [place], MD Primary Care Provider: Scherger, Lattie PENNER, PA-C Reason for consultation: Subjective   Assessment & Plan  Diagnoses and all orders for this visit:  Uncontrolled type 1 diabetes mellitus with hypoglycemia without coma (HCC) -     POCT glycosylated hemoglobin (Hb A1C) -     Glucagon (BAQSIMI ONE PACK) 3 MG/DOSE POWD; Place 1 Device into the nose as needed (Low blood sugar with impaired consciousness). -     Microalbumin / creatinine urine ratio -     Lipid panel -     Comprehensive metabolic panel with GFR  Insulin  pump in place  Insulin  pump titration  Other orders -     Insulin  Aspart, w/Niacinamide , (FIASP ) 100 UNIT/ML SOLN; Inject upto 60 units via insulin  pump once daily   Diabetes Type I complicated by hypoglycemia, No results found for: GFR Hba1c goal less than 7, current Hba1c is  Lab Results  Component Value Date   HGBA1C 5.6 03/24/2024   Will recommend the following: Omnipod 5 with fiasp  and G7 Basal: 1am-3 am 1.6 3am-8am 1.4 8am-9pm 1.6 9pm-1am: 1.7  Bolus: IC 12am 8      12pm 8.5 ISF 1:35 Target 110, corrects above 120 IOB 3 hrs  Has Tresiba and Fiasp  pens with needles for back up  No known contraindications/side effects to any of above medications Glucagon discussed and prescribed with refills on 03/23/24   -Last LD and Tg are as follows: No results found for: LDLCALC No results found for: TRIG -not on statin  -Follow low fat diet and exercise   -Blood pressure goal <140/90 - Microalbumin/creatinine goal is < 30 -Last MA/Cr is as follows: No results found for: MICROALBUR, MALB24HUR -not on ACE/ARB  -diet changes including salt restriction -limit eating outside -counseled BP targets per standards of diabetes care -uncontrolled blood pressure can lead to retinopathy, nephropathy  and cardiovascular and atherosclerotic heart disease  Reviewed and counseled on: -A1C target -Blood sugar targets -Complications of uncontrolled diabetes  -Checking blood sugar before meals and bedtime and bring log next visit -All medications with mechanism of action and side effects -Hypoglycemia management: rule of 15's, Glucagon Emergency Kit and medical alert ID -low-carb low-fat plate-method diet -At least 20 minutes of physical activity per day -Annual dilated retinal eye exam and foot exam -compliance and follow up needs -follow up as scheduled or earlier if problem gets worse  Call if blood sugar is less than 70 or consistently above 250    Take a 15 gm snack of carbohydrate at bedtime before you go to sleep if your blood sugar is less than 100.    If you are going to fast after midnight for a test or procedure, ask your physician for instructions on how to reduce/decrease your insulin  dose.    Call if blood sugar is less than 70 or consistently above 250  -Treating a low sugar by rule of 15  (15 gms of sugar every 15 min until sugar is more than 70) If you feel your sugar is low, test your sugar to be sure If your sugar is low (less than 70), then take 15 grams of a fast acting Carbohydrate (3-4 glucose tablets or glucose gel or 4 ounces of juice or regular soda) Recheck your sugar 15 min after treating low to make sure it is more than 70 If sugar is still  less than 70, treat again with 15 grams of carbohydrate          Don't drive the hour of hypoglycemia  If unconscious/unable to eat or drink by mouth, use glucagon injection or nasal spray baqsimi and call 911. Can repeat again in 15 min if still unconscious.  Return in about 30 days (around 04/23/2024) for tele-visit: 9 am to discuss hashimoto's , labs today.   I have reviewed current medications, nurse's notes, allergies, vital signs, past medical and surgical history, family medical history, and social history for this  encounter. Counseled patient on symptoms, examination findings, lab findings, imaging results, treatment decisions and monitoring and prognosis. The patient understood the recommendations and agrees with the treatment plan. All questions regarding treatment plan were fully answered.  Chelsea Birmingham, MD  03/24/24    History of Present Illness Chelsea Rollins is a 28 y.o. year old female who presents for evaluation of Type I diabetes mellitus.  Chelsea Rollins was first diagnosed at age 28.   Diabetes education +  Home diabetes regimen: Omnipod 5 with fiasp  and G7 Omnipod 5 with fiasp  and G7 Basal: 12am-3 am 1.6 3am-8am 1.4 8am-12am 1.6  Bolus: IC 12am 8      12pm 8.5 ISF 1:35 Target 110, corrects above 120 IOB 3 hrs  COMPLICATIONS -  MI/Stroke -  retinopathy -  neuropathy -  nephropathy  SYMPTOMS REVIEWED - Polyuria - Weight loss - Blurred vision  BLOOD SUGAR DATA CGM interpretation: At today's visit, we reviewed her CGM downloads. The full report is scanned in the media. Reviewing the CGM trends, BG are well controlled most of the day with some intermittent scattered lows.   Physical Exam  BP 130/80   Pulse (!) 107   Ht 5' 4 (1.626 m)   Wt 165 lb (74.8 kg)   SpO2 98%   BMI 28.32 kg/m    Constitutional: well developed, well nourished Head: normocephalic, atraumatic Eyes: sclera anicteric, no redness Neck: supple, no thyromegaly/thyroid  nodules/thyroid  tenderness noted  Lungs: normal respiratory effort Neurology: alert and oriented Skin: dry, no appreciable rashes Musculoskeletal: no appreciable defects Psychiatric: normal mood and affect Diabetic Foot Exam - Simple   Simple Foot Form Diabetic Foot exam was performed with the following findings: Yes 03/24/2024  9:43 AM  Visual Inspection No deformities, no ulcerations, no other skin breakdown bilaterally: Yes Sensation Testing Intact to touch and monofilament testing bilaterally: Yes Pulse  Check Posterior Tibialis and Dorsalis pulse intact bilaterally: Yes Comments      Current Medications Patient's Medications  New Prescriptions   GLUCAGON (BAQSIMI ONE PACK) 3 MG/DOSE POWD    Place 1 Device into the nose as needed (Low blood sugar with impaired consciousness).  Previous Medications   BLOOD GLUCOSE METER KIT AND SUPPLIES    Check blood sugar three times a day before meals and at bedtime.   INSULIN  ASPART (FIASP  FLEXTOUCH) 100 UNIT/ML FLEXPEN    Inject into the skin 3 (three) times daily with meals.   INSULIN  DEGLUDEC (TRESIBA FLEXTOUCH) 100 UNIT/ML SOPN FLEXTOUCH PEN    Inject 14 Units into the skin daily at 10 pm.   INSULIN  PEN NEEDLE 31G X 5 MM MISC    1 application by Does not apply route daily.   ISIBLOOM  0.15-30 MG-MCG TABLET    Take 1 tablet by mouth daily.   MULTIPLE VITAMIN (MULTIVITAMIN) CAPSULE    Take 1 capsule by mouth daily.   VALACYCLOVIR  (VALTREX ) 500 MG TABLET  Take 500 mg by mouth daily.  Modified Medications   Modified Medication Previous Medication   INSULIN  ASPART, W/NIACINAMIDE , (FIASP ) 100 UNIT/ML SOLN Insulin  Aspart, w/Niacinamide , (FIASP ) 100 UNIT/ML SOLN      Inject upto 60 units via insulin  pump once daily    Inject upto 60 units via insulin  pump once daily  Discontinued Medications   INSULIN  DETEMIR (LEVEMIR ) 100 UNIT/ML PEN    Inject 7 Units into the skin daily at 10 pm.    Allergies No Known Allergies  Past Medical History Past Medical History:  Diagnosis Date   Diabetes mellitus without complication (HCC)    Headache(784.0)     Past Surgical History Past Surgical History:  Procedure Laterality Date   SHOULDER SURGERY  05/12/13   right shoulder- torn labrum(loose ligaments)   WISDOM TOOTH EXTRACTION      Family History family history includes Diabetes in her maternal grandfather and another family member; Diabetes Mellitus II in her father; Heart Problems in her maternal grandfather, paternal grandfather, and paternal  grandmother; Hypertension in her father.  Social History Social History   Socioeconomic History   Marital status: Single    Spouse name: Not on file   Number of children: Not on file   Years of education: Not on file   Highest education level: Not on file  Occupational History   Occupation: student  Tobacco Use   Smoking status: Never   Smokeless tobacco: Never  Substance and Sexual Activity   Alcohol use: No   Drug use: No   Sexual activity: Not on file  Other Topics Concern   Not on file  Social History Narrative   Not on file   Social Drivers of Health   Financial Resource Strain: Not on file  Food Insecurity: Not on file  Transportation Needs: Not on file  Physical Activity: Not on file  Stress: Not on file  Social Connections: Not on file  Intimate Partner Violence: Not on file    Lab Results  Component Value Date   HGBA1C 5.6 03/24/2024   HGBA1C 11.1 (H) 09/03/2016   No results found for: CHOL No results found for: HDL No results found for: LDLCALC No results found for: TRIG No results found for: [facility] Lab Results  Component Value Date   CREATININE 0.62 09/03/2016   No results found for: GFR No results found for: MACKEY CURRENT    Component Value Date/Time   NA 135 09/03/2016 0744   K 3.7 09/03/2016 0744   CL 105 09/03/2016 0744   CO2 22 09/03/2016 0744   GLUCOSE 164 (H) 09/03/2016 0744   BUN 10 09/03/2016 0744   CREATININE 0.62 09/03/2016 0744   CALCIUM 8.8 (L) 09/03/2016 0744   GFRNONAA >60 09/03/2016 0744   GFRAA >60 09/03/2016 0744      Latest Ref Rng & Units 09/03/2016    7:44 AM 09/03/2016    1:05 AM 09/02/2016    8:59 PM  BMP  Glucose 65 - 99 mg/dL 835  874  607   BUN 6 - 20 mg/dL 10  11  15    Creatinine 0.44 - 1.00 mg/dL 9.37  9.29  9.14   Sodium 135 - 145 mmol/L 135  137  132   Potassium 3.5 - 5.1 mmol/L 3.7  2.9  3.3   Chloride 101 - 111 mmol/L 105  104  96   CO2 22 - 32 mmol/L 22  23  19    Calcium 8.9  - 10.3 mg/dL 8.8  8.7  9.4        Component Value Date/Time   WBC 10.7 (H) 09/02/2016 2059   RBC 4.36 09/02/2016 2059   HGB 13.6 09/02/2016 2059   HCT 37.3 09/02/2016 2059   PLT 288 09/02/2016 2059   MCV 85.6 09/02/2016 2059   MCH 31.2 09/02/2016 2059   MCHC 36.5 (H) 09/02/2016 2059   RDW 11.4 (L) 09/02/2016 2059   LYMPHSABS 2.2 09/02/2016 2059   MONOABS 0.5 09/02/2016 2059   EOSABS 0.0 09/02/2016 2059   BASOSABS 0.0 09/02/2016 2059     Parts of this note may have been dictated using voice recognition software. There may be variances in spelling and vocabulary which are unintentional. Not all errors are proofread. Please notify the dino if any discrepancies are noted or if the meaning of any statement is not clear.

## 2024-03-25 LAB — COMPREHENSIVE METABOLIC PANEL WITH GFR
AG Ratio: 1.8 (calc) (ref 1.0–2.5)
ALT: 10 U/L (ref 6–29)
AST: 12 U/L (ref 10–30)
Albumin: 4.7 g/dL (ref 3.6–5.1)
Alkaline phosphatase (APISO): 39 U/L (ref 31–125)
BUN: 12 mg/dL (ref 7–25)
CO2: 26 mmol/L (ref 20–32)
Calcium: 10 mg/dL (ref 8.6–10.2)
Chloride: 103 mmol/L (ref 98–110)
Creat: 0.8 mg/dL (ref 0.50–0.96)
Globulin: 2.6 g/dL (ref 1.9–3.7)
Glucose, Bld: 102 mg/dL — ABNORMAL HIGH (ref 65–99)
Potassium: 4.4 mmol/L (ref 3.5–5.3)
Sodium: 137 mmol/L (ref 135–146)
Total Bilirubin: 0.5 mg/dL (ref 0.2–1.2)
Total Protein: 7.3 g/dL (ref 6.1–8.1)
eGFR: 104 mL/min/1.73m2 (ref >=60)

## 2024-03-25 LAB — MICROALBUMIN / CREATININE URINE RATIO
Creatinine, Urine: 32 mg/dL (ref 20–275)
Microalb, Ur: <0.2 mg/dL

## 2024-03-25 LAB — LIPID PANEL
Cholesterol: 193 mg/dL (ref <200)
HDL: 62 mg/dL (ref >=50)
LDL Cholesterol (Calc): 118 mg/dL — ABNORMAL HIGH
Non-HDL Cholesterol (Calc): 131 mg/dL — ABNORMAL HIGH (ref <130)
Total CHOL/HDL Ratio: 3.1 (calc) (ref <5.0)
Triglycerides: 42 mg/dL (ref <150)

## 2024-03-27 ENCOUNTER — Encounter: Payer: Self-pay | Admitting: "Endocrinology

## 2024-04-23 ENCOUNTER — Telehealth: Admitting: "Endocrinology

## 2024-04-23 ENCOUNTER — Encounter: Payer: Self-pay | Admitting: "Endocrinology

## 2024-04-23 VITALS — Ht 64.0 in | Wt 158.0 lb

## 2024-04-23 DIAGNOSIS — Z9641 Presence of insulin pump (external) (internal): Secondary | ICD-10-CM | POA: Diagnosis not present

## 2024-04-23 DIAGNOSIS — Z8349 Family history of other endocrine, nutritional and metabolic diseases: Secondary | ICD-10-CM | POA: Diagnosis not present

## 2024-04-23 DIAGNOSIS — E10649 Type 1 diabetes mellitus with hypoglycemia without coma: Secondary | ICD-10-CM | POA: Diagnosis not present

## 2024-04-23 DIAGNOSIS — R7689 Other specified abnormal immunological findings in serum: Secondary | ICD-10-CM | POA: Diagnosis not present

## 2024-04-23 MED ORDER — OMNIPOD 5 G7 PODS (GEN 5) MISC: 1.0000 | 11 refills | Status: AC

## 2024-04-23 MED ORDER — DEXCOM G7 SENSOR MISC: 1.0000 | 3 refills | Status: AC

## 2024-04-23 NOTE — Progress Notes (Signed)
 "         Outpatient Endocrinology Note Chelsea Birmingham, MD  04/23/2024   Chelsea Rollins 08-03-1995 [id number]  Referring Provider: Scherger, Lattie PENNER, PA-C Primary Care Provider: Scherger, Lattie PENNER, PA-C Reason for consultation: Subjective   Assessment & Plan  Diagnoses and all orders for this visit:  Family history of thyroid  disease -     TSH -     T4, free  Anti-TPO antibodies present -     TSH -     T4, free  Uncontrolled type 1 diabetes mellitus with hypoglycemia without coma (HCC)  Insulin  pump in place  Other orders -     Continuous Glucose Sensor (DEXCOM G7 SENSOR) MISC; 1 Device by Does not apply route continuous. -     Insulin  Disposable Pump (OMNIPOD 5 G7 PODS, GEN 5,) MISC; 1 Box by Does not apply route continuous.    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 Patint is also trying to switch to Activity mode on her pump to avoid lows and does not think she needs an adjustment on pump settings at this time  Has Tresiba and Fiasp  pens with needles for back up  No known contraindications/side effects to any of above medications Glucagon  discussed and prescribed with refills on 03/23/24   -Last LD and Tg are as follows: Lab Results  Component Value Date   LDLCALC 118  (H) 03/24/2024    Lab Results  Component Value Date   TRIG 42 03/24/2024   -not on statin  -Follow low fat diet and exercise   -Blood pressure goal <140/90 - Microalbumin/creatinine goal is < 30 -Last MA/Cr is as follows: Lab Results  Component Value Date   MICROALBUR <0.2 03/24/2024   -not on ACE/ARB  -diet changes including salt restriction -limit eating outside -counseled BP targets per standards of diabetes care -uncontrolled blood pressure can lead to retinopathy, nephropathy and cardiovascular and atherosclerotic heart disease  Reviewed and counseled on: -A1C target -Blood sugar targets -Complications of uncontrolled diabetes  -Checking blood sugar before meals and bedtime and bring log next visit -All medications with mechanism of action and side effects -Hypoglycemia management: rule of 15's, Glucagon  Emergency Kit and medical alert ID -low-carb low-fat plate-method diet -At least 20 minutes of physical activity per day -Annual dilated retinal eye exam and foot exam -compliance and follow up needs -follow up as scheduled or earlier if problem gets worse  Call if blood sugar is less than 70 or consistently above 250    Take a 15 gm snack of carbohydrate at bedtime before you go to sleep if your blood sugar is less than 100.    If you are going to fast after midnight for a test or procedure, ask your physician  for instructions on how to reduce/decrease your insulin  dose.    Call if blood sugar is less than 70 or consistently above 250  -Treating a low sugar by rule of 15  (15 gms of sugar every 15 min until sugar is more than 70) If you feel your sugar is low, test your sugar to be sure If your sugar is low (less than 70), then take 15 grams of a fast acting Carbohydrate (3-4 glucose tablets or glucose gel or 4 ounces of juice or regular soda) Recheck your sugar 15 min after treating low to make sure it is more than 70 If sugar is still less than 70, treat again  with 15 grams of carbohydrate          Don't drive the hour of hypoglycemia  If unconscious/unable to eat or drink by mouth, use glucagon  injection or nasal spray baqsimi  and call 911. Can repeat again in 15 min if still unconscious.  Return in about 3 months (around 07/22/2024) for visit + labs before next visit.   I have reviewed current medications, nurse's notes, allergies, vital signs, past medical and surgical history, family medical history, and social history for this encounter. Counseled patient on symptoms, examination findings, lab findings, imaging results, treatment decisions and monitoring and prognosis. The patient understood the recommendations and agrees with the treatment plan. All questions regarding treatment plan were fully answered.  Chelsea Birmingham, MD  04/23/2024    History of Present Illness Chelsea Rollins is a 29 y.o. year old female who presents for evaluation of Type I diabetes mellitus.  Chelsea Rollins was first diagnosed at age 29.   Diabetes education +  Home diabetes regimen: Omnipod 5 with fiasp  and G7 Basal: 12am-3 am 1.6 3am-8am 1.4 8am-12am 1.6  Bolus: IC 12am 8      12pm 8.5 ISF 1:35 Target 110, corrects above 120 IOB 3 hrs  COMPLICATIONS -  MI/Stroke -  retinopathy -  neuropathy -  nephropathy  BLOOD SUGAR DATA CGM interpretation: At today's visit, we reviewed her CGM downloads. The full report is scanned in the media. Reviewing the CGM trends, BG are well controlled most of the day with some intermittent scattered lows in evening>morning/afternoon. Some lows are happening around movement.    Physical Exam  Ht 5' 4 (1.626 m)   Wt 158 lb (71.7 kg)   BMI 27.12 kg/m    Constitutional: well developed, well nourished Head: normocephalic, atraumatic Eyes: sclera anicteric, no redness Neck: supple, no thyromegaly/thyroid  nodules/thyroid  tenderness noted  Lungs: normal respiratory effort Neurology: alert and oriented Skin:  dry, no appreciable rashes Musculoskeletal: no appreciable defects Psychiatric: normal mood and affect Diabetic Foot Exam - Simple   No data filed      Current Medications Patient's Medications  New Prescriptions   CONTINUOUS GLUCOSE SENSOR (DEXCOM G7 SENSOR) MISC    1 Device by Does not apply route continuous.   INSULIN  DISPOSABLE PUMP (OMNIPOD 5 G7 PODS, GEN 5,) MISC    1 Box by Does not apply route continuous.  Previous Medications   BLOOD GLUCOSE METER KIT AND SUPPLIES    Check blood sugar three times a day before meals and at bedtime.   GLUCAGON  (BAQSIMI  ONE PACK) 3 MG/DOSE POWD    Place 1 Device into the nose as needed (Low blood sugar with impaired consciousness).   INSULIN  ASPART (FIASP  FLEXTOUCH) 100 UNIT/ML FLEXPEN    Inject into the skin 3 (three) times daily with meals.  INSULIN  ASPART, W/NIACINAMIDE , (FIASP ) 100 UNIT/ML SOLN    Inject upto 60 units via insulin  pump once daily   INSULIN  DEGLUDEC (TRESIBA FLEXTOUCH) 100 UNIT/ML SOPN FLEXTOUCH PEN    Inject 14 Units into the skin daily at 10 pm.   INSULIN  PEN NEEDLE 31G X 5 MM MISC    1 application by Does not apply route daily.   ISIBLOOM  0.15-30 MG-MCG TABLET    Take 1 tablet by mouth daily.   MULTIPLE VITAMIN (MULTIVITAMIN) CAPSULE    Take 1 capsule by mouth daily.   VALACYCLOVIR  (VALTREX ) 500 MG TABLET    Take 500 mg by mouth daily.  Modified Medications   No medications on file  Discontinued Medications   No medications on file    Allergies No Known Allergies  Past Medical History Past Medical History:  Diagnosis Date   Diabetes mellitus without complication (HCC)    Headache(784.0)     Past Surgical History Past Surgical History:  Procedure Laterality Date   SHOULDER SURGERY  05/12/13   right shoulder- torn labrum(loose ligaments)   WISDOM TOOTH EXTRACTION      Family History family history includes Diabetes in her maternal grandfather and another family member; Diabetes Mellitus II in her father; Heart  Problems in her maternal grandfather, paternal grandfather, and paternal grandmother; Hypertension in her father.  Social History Social History   Socioeconomic History   Marital status: Single    Spouse name: Not on file   Number of children: Not on file   Years of education: Not on file   Highest education level: Not on file  Occupational History   Occupation: student  Tobacco Use   Smoking status: Never   Smokeless tobacco: Never  Substance and Sexual Activity   Alcohol use: No   Drug use: No   Sexual activity: Not on file  Other Topics Concern   Not on file  Social History Narrative   Not on file   Social Drivers of Health   Tobacco Use: Low Risk (04/23/2024)   Patient History    Smoking Tobacco Use: Never    Smokeless Tobacco Use: Never    Passive Exposure: Not on file  Financial Resource Strain: Not on file  Food Insecurity: Not on file  Transportation Needs: Not on file  Physical Activity: Not on file  Stress: Not on file  Social Connections: Not on file  Intimate Partner Violence: Not on file  Depression (PHQ2-9): Not on file  Alcohol Screen: Not on file  Housing: Not on file  Utilities: Not on file  Health Literacy: Not on file    Lab Results  Component Value Date   HGBA1C 5.6 03/24/2024   HGBA1C 11.1 (H) 09/03/2016   Lab Results  Component Value Date   CHOL 193 03/24/2024   Lab Results  Component Value Date   HDL 62 03/24/2024   Lab Results  Component Value Date   LDLCALC 118 (H) 03/24/2024   Lab Results  Component Value Date   TRIG 42 03/24/2024   Lab Results  Component Value Date   CHOLHDL 3.1 03/24/2024   Lab Results  Component Value Date   CREATININE 0.80 03/24/2024   No results found for: GFR Lab Results  Component Value Date   MICROALBUR <0.2 03/24/2024      Component Value Date/Time   NA 137 03/24/2024 0956   K 4.4 03/24/2024 0956   CL 103 03/24/2024 0956   CO2 26 03/24/2024 0956   GLUCOSE 102 (H) 03/24/2024  0956    BUN 12 03/24/2024 0956   CREATININE 0.80 03/24/2024 0956   CALCIUM 10.0 03/24/2024 0956   PROT 7.3 03/24/2024 0956   AST 12 03/24/2024 0956   ALT 10 03/24/2024 0956   BILITOT 0.5 03/24/2024 0956   GFRNONAA >60 09/03/2016 0744   GFRAA >60 09/03/2016 0744      Latest Ref Rng & Units 03/24/2024    9:56 AM 09/03/2016    7:44 AM 09/03/2016    1:05 AM  BMP  Glucose 65 - 99 mg/dL 897  835  874   BUN 7 - 25 mg/dL 12  10  11    Creatinine 0.50 - 0.96 mg/dL 9.19  9.37  9.29   BUN/Creat Ratio 6 - 22 (calc) SEE NOTE:     Sodium 135 - 146 mmol/L 137  135  137   Potassium 3.5 - 5.3 mmol/L 4.4  3.7  2.9   Chloride 98 - 110 mmol/L 103  105  104   CO2 20 - 32 mmol/L 26  22  23    Calcium 8.6 - 10.2 mg/dL 89.9  8.8  8.7        Component Value Date/Time   WBC 10.7 (H) 09/02/2016 2059   RBC 4.36 09/02/2016 2059   HGB 13.6 09/02/2016 2059   HCT 37.3 09/02/2016 2059   PLT 288 09/02/2016 2059   MCV 85.6 09/02/2016 2059   MCH 31.2 09/02/2016 2059   MCHC 36.5 (H) 09/02/2016 2059   RDW 11.4 (L) 09/02/2016 2059   LYMPHSABS 2.2 09/02/2016 2059   MONOABS 0.5 09/02/2016 2059   EOSABS 0.0 09/02/2016 2059   BASOSABS 0.0 09/02/2016 2059     Parts of this note may have been dictated using voice recognition software. There may be variances in spelling and vocabulary which are unintentional. Not all errors are proofread. Please notify the dino if any discrepancies are noted or if the meaning of any statement is not clear.   "

## 2024-04-26 ENCOUNTER — Telehealth: Payer: Self-pay

## 2024-04-26 NOTE — Telephone Encounter (Signed)
 Pharmacy called regarding omnipod. How often is pt changing pod?

## 2024-04-26 NOTE — Telephone Encounter (Signed)
 Lvm for pt to call back regarding ominpod.

## 2024-04-27 ENCOUNTER — Encounter: Payer: Self-pay | Admitting: "Endocrinology
